# Patient Record
Sex: Female | Born: 2000 | Race: White | Hispanic: No | Marital: Single | Smoking: Never smoker
Health system: Southern US, Community
[De-identification: ages and names within clinical notes are randomized; demographics above are authoritative.]

## PROBLEM LIST (undated history)

## (undated) DIAGNOSIS — Z7689 Persons encountering health services in other specified circumstances: Principal | ICD-10-CM

## (undated) DIAGNOSIS — N898 Other specified noninflammatory disorders of vagina: Principal | ICD-10-CM

## (undated) DIAGNOSIS — N390 Urinary tract infection, site not specified: Secondary | ICD-10-CM

## (undated) DIAGNOSIS — N760 Acute vaginitis: Secondary | ICD-10-CM

## (undated) DIAGNOSIS — B9689 Other specified bacterial agents as the cause of diseases classified elsewhere: Secondary | ICD-10-CM

## (undated) HISTORY — DX: Acute vaginitis: N76.0

## (undated) HISTORY — PX: WISDOM TOOTH EXTRACTION: SHX21

## (undated) HISTORY — DX: Persons encountering health services in other specified circumstances: Z76.89

## (undated) HISTORY — DX: Other specified noninflammatory disorders of vagina: N89.8

## (undated) HISTORY — DX: Other specified bacterial agents as the cause of diseases classified elsewhere: B96.89

---
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
## 2018-01-28 ENCOUNTER — Encounter: Payer: Self-pay | Admitting: Emergency Medicine

## 2018-01-28 ENCOUNTER — Other Ambulatory Visit: Payer: Self-pay

## 2018-01-28 ENCOUNTER — Ambulatory Visit
Admission: EM | Admit: 2018-01-28 | Discharge: 2018-01-28 | Disposition: A | Discharge status: Home / Self Care | Payer: BLUE CROSS/BLUE SHIELD | Attending: Family Medicine | Admitting: Family Medicine

## 2018-01-28 DIAGNOSIS — R112 Nausea with vomiting, unspecified: Secondary | ICD-10-CM | POA: Diagnosis not present

## 2018-01-28 DIAGNOSIS — Z3202 Encounter for pregnancy test, result negative: Secondary | ICD-10-CM

## 2018-01-28 DIAGNOSIS — R197 Diarrhea, unspecified: Secondary | ICD-10-CM | POA: Diagnosis not present

## 2018-01-28 LAB — CBC WITH DIFFERENTIAL/PLATELET
BASOS ABS: 0.1 10*3/uL (ref 0–0.1)
BASOS PCT: 1 %
Eosinophils Absolute: 0 10*3/uL (ref 0–0.7)
Eosinophils Relative: 0 %
HEMATOCRIT: 43 % (ref 35.0–47.0)
Hemoglobin: 14.9 g/dL (ref 12.0–16.0)
LYMPHS PCT: 15 %
Lymphs Abs: 1 10*3/uL (ref 1.0–3.6)
MCH: 29.8 pg (ref 26.0–34.0)
MCHC: 34.7 g/dL (ref 32.0–36.0)
MCV: 86.1 fL (ref 80.0–100.0)
Monocytes Absolute: 0.3 10*3/uL (ref 0.2–0.9)
Monocytes Relative: 4 %
NEUTROS ABS: 5.5 10*3/uL (ref 1.4–6.5)
Neutrophils Relative %: 80 %
PLATELETS: 213 10*3/uL (ref 150–440)
RBC: 4.99 MIL/uL (ref 3.80–5.20)
RDW: 13.3 % (ref 11.5–14.5)
WBC: 6.9 10*3/uL (ref 3.6–11.0)

## 2018-01-28 LAB — COMPREHENSIVE METABOLIC PANEL
ALBUMIN: 4.5 g/dL (ref 3.5–5.0)
ALT: 12 U/L (ref 0–44)
AST: 16 U/L (ref 15–41)
Alkaline Phosphatase: 82 U/L (ref 38–126)
Anion gap: 10 (ref 5–15)
BILIRUBIN TOTAL: 0.9 mg/dL (ref 0.3–1.2)
BUN: 8 mg/dL (ref 6–20)
CHLORIDE: 108 mmol/L (ref 98–111)
CO2: 22 mmol/L (ref 22–32)
CREATININE: 0.81 mg/dL (ref 0.44–1.00)
Calcium: 9.6 mg/dL (ref 8.9–10.3)
GFR calc Af Amer: >60 mL/min (ref >60)
GLUCOSE: 101 mg/dL — AB (ref 70–99)
Potassium: 3.8 mmol/L (ref 3.5–5.1)
Sodium: 140 mmol/L (ref 135–145)
Total Protein: 7.8 g/dL (ref 6.5–8.1)

## 2018-01-28 LAB — PREGNANCY, URINE: PREG TEST UR: NEGATIVE

## 2018-01-28 MED ORDER — ONDANSETRON 4 MG PO TBDP
4.0000 mg | ORAL_TABLET | Freq: Once | ORAL | Status: AC
Start: 2018-01-28 — End: 2018-01-28
  Administered 2018-01-28: 4 mg via ORAL

## 2018-01-28 MED ORDER — ONDANSETRON HCL 4 MG PO TABS: 4.0000 mg | ORAL_TABLET | Freq: Three times a day (TID) | ORAL | 0 refills | Status: DC | PRN

## 2018-01-28 NOTE — ED Triage Notes (Signed)
Patient c/o N/V/D off and on for 2 weeks.  Patient reports that she has nausea and vomiting every morning when she wakes up.

## 2018-01-28 NOTE — Discharge Instructions (Signed)
Medication as directed.  Lots of fluids.  Take care  Dr. Loriana Samad  

## 2018-01-28 NOTE — ED Provider Notes (Signed)
[facility]    CSN: [id number] Arrival date & time: 01/28/18  1035  History   Chief Complaint Chief Complaint  Patient presents with  . Nausea  . Emesis   HPI  17 year old female presents with nausea, vomiting, diarrhea.  Patient reports ongoing nausea, vomiting, diarrhea for the past 1.5 to 2 weeks.  She states that she recently suffered  loss of a friend.  Nausea and vomiting is most prominent in the morning.  She has intermittent diarrhea.  She states that she is averaging approximately 5 loose stools a day.  Emesis is nonbloody and nonbilious.  She states that it has been several months and she had a period. She has a Nexplanon in place.  No reports of fevers or chills.  No reported sick contacts.  She reports associated dizziness and presyncope.  Symptoms were worse today which prompted her visit.  No other complaints.  Past Medical History:  Diagnosis Date  . BV (bacterial vaginosis) 12/13/2014  . Menstrual extraction 02/14/2014  . Vaginal discharge 12/13/2014    Patient Active Problem List   Diagnosis Date Noted  . Nexplanon in place 02/26/2015  . Vaginal discharge 12/13/2014  . BV (bacterial vaginosis) 12/13/2014  . Menstrual extraction 02/14/2014    History reviewed. No pertinent surgical history.  OB History    Gravida  0   Para  0   Term  0   Preterm  0   AB  0   Living  0     SAB  0   TAB  0   Ectopic  0   Multiple  0   Live Births               Home Medications    Prior to Admission medications   Medication Sig Start Date End Date Taking? Authorizing Provider  busPIRone (BUSPAR) 10 MG tablet Take 10 mg by mouth 3 (three) times daily.   Yes [provider]  etonogestrel (NEXPLANON) 68 MG IMPL implant 1 each by Subdermal route once.   Yes [provider]  ondansetron (ZOFRAN) 4 MG tablet Take 1 tablet (4 mg total) by mouth every 8 (eight) hours as needed for nausea or vomiting. 01/28/18   Tommie Samsook, Marvie Calender G, DO     Family History Family History  Problem Relation Age of Onset  . Thyroid disease Mother   . Thyroid disease Maternal Grandmother     Social History Social History   Tobacco Use  . Smoking status: Never Smoker  . Smokeless tobacco: Never Used  Substance Use Topics  . Alcohol use: No  . Drug use: No     Allergies   Patient has no known allergies.   Review of Systems Review of Systems  Constitutional: Negative for fever.  Respiratory: Positive for chest tightness.   Gastrointestinal: Positive for diarrhea, nausea and vomiting.  Neurological: Positive for dizziness.   Physical Exam Triage Vital Signs ED Triage Vitals  Enc Vitals Group     BP 01/28/18 1046 122/82     Pulse Rate 01/28/18 1046 64     Resp 01/28/18 1046 14     Temp 01/28/18 1046 97.9 F (36.6 C)     Temp Source 01/28/18 1046 Oral     SpO2 01/28/18 1046 98 %     Weight 01/28/18 1047 125 lb (56.7 kg)     Height 01/28/18 1047 5\' 3"  (1.6 m)     Head Circumference --      Peak Flow --  Pain Score 01/28/18 1046 3     Pain Loc --      Pain Edu? --      Excl. in GC? --    Updated Vital Signs BP 122/82 (BP Location: Left Arm)   Pulse 64   Temp 97.9 F (36.6 C) (Oral)   Resp 14   Ht 5\' 3"  (1.6 m)   Wt 125 lb (56.7 kg)   LMP 01/25/2018   SpO2 98%   BMI 22.14 kg/m   Visual Acuity Right Eye Distance:   Left Eye Distance:   Bilateral Distance:    Right Eye Near:   Left Eye Near:    Bilateral Near:     Physical Exam  Constitutional: She is oriented to person, place, and time. She appears well-developed. No distress.  HENT:  Head: Normocephalic and atraumatic.  Mouth/Throat: Oropharynx is clear and moist.  Cardiovascular: Normal rate and regular rhythm.  Pulmonary/Chest: Effort normal and breath sounds normal. She has no wheezes. She has no rales.  Abdominal: Soft. She exhibits no distension. There is no tenderness.  Neurological: She is alert and oriented to person, place, and time.   Psychiatric: She has a normal mood and affect. Her behavior is normal.  Nursing note and vitals reviewed.  UC Treatments / Results  Labs (all labs ordered are listed, but only abnormal results are displayed) Labs Reviewed  COMPREHENSIVE METABOLIC PANEL - Abnormal; Notable for the following components:      Result Value   Glucose, Bld 101 (*)    All other components within normal limits  CBC WITH DIFFERENTIAL/PLATELET  PREGNANCY, URINE    EKG None  Radiology No results found.  Procedures Procedures (including critical care time)  Medications Ordered in UC Medications  ondansetron (ZOFRAN-ODT) disintegrating tablet 4 mg (4 mg Oral Given 01/28/18 1107)    Initial Impression / Assessment and Plan / UC Course  I have reviewed the triage vital signs and the nursing notes.  Pertinent labs & imaging results that were available during my care of the patient were reviewed by me and considered in my medical decision making (see chart for details).    17 year old female presents with nausea, vomiting, diarrhea.  Exam unremarkable.  Labs unrevealing.  Zofran as needed.  Supportive care and push fluids.  Work note given.  Final Clinical Impressions(s) / UC Diagnoses   Final diagnoses:  Nausea vomiting and diarrhea   Discharge Instructions   None    ED Prescriptions    Medication Sig Dispense Auth. Provider   ondansetron (ZOFRAN) 4 MG tablet Take 1 tablet (4 mg total) by mouth every 8 (eight) hours as needed for nausea or vomiting. 20 tablet Tommie Sams, DO     Controlled Substance Prescriptions [place] Controlled Substance Registry consulted? Not Applicable   Tommie Sams, DO 01/28/18 1151

## 2018-09-07 DIAGNOSIS — K529 Noninfective gastroenteritis and colitis, unspecified: Secondary | ICD-10-CM | POA: Diagnosis not present

## 2018-09-07 DIAGNOSIS — J111 Influenza due to unidentified influenza virus with other respiratory manifestations: Secondary | ICD-10-CM | POA: Diagnosis not present

## 2018-09-07 DIAGNOSIS — J329 Chronic sinusitis, unspecified: Secondary | ICD-10-CM | POA: Diagnosis not present

## 2019-01-22 DIAGNOSIS — N390 Urinary tract infection, site not specified: Secondary | ICD-10-CM | POA: Diagnosis not present

## 2019-01-22 DIAGNOSIS — Z113 Encounter for screening for infections with a predominantly sexual mode of transmission: Secondary | ICD-10-CM | POA: Diagnosis not present

## 2019-01-24 ENCOUNTER — Ambulatory Visit (INDEPENDENT_AMBULATORY_CARE_PROVIDER_SITE_OTHER)
Admission: RE | Admit: 2019-01-24 | Discharge: 2019-01-24 | Disposition: A | Discharge status: Home / Self Care | Payer: BC Managed Care – PPO | Source: Ambulatory Visit

## 2019-01-24 DIAGNOSIS — N760 Acute vaginitis: Secondary | ICD-10-CM | POA: Diagnosis not present

## 2019-01-24 DIAGNOSIS — B9689 Other specified bacterial agents as the cause of diseases classified elsewhere: Secondary | ICD-10-CM | POA: Diagnosis not present

## 2019-01-24 MED ORDER — METRONIDAZOLE 0.75 % VA GEL: 1.0000 | Freq: Every day | VAGINAL | 0 refills | Status: AC

## 2019-01-24 NOTE — Discharge Instructions (Signed)
We will change your medication Follow up as needed for continued or worsening symptoms

## 2019-01-24 NOTE — ED Provider Notes (Signed)
Virtual Visit via Video Note:  Hansika Leaming  initiated request for Telemedicine visit with [facility] team. I connected with Jeannine Boga  on 01/24/2019 at 9:28 AM  for a synchronized telemedicine visit using a video enabled HIPPA compliant telemedicine application. I verified that I am speaking with Jeannine Boga  using two identifiers. Orvan July, NP  was physically located in a [facility] Urgent care site and Aolani Piggott was located at a different location.   The limitations of evaluation and management by telemedicine as well as the availability of in-person appointments were discussed. Patient was informed that she  may incur a bill ( including co-pay) for this virtual visit encounter. Robertine Gully  expressed understanding and gave verbal consent to proceed with virtual visit.     History of Present Illness:Lori [name]  is a 18 y.o. female presents with needing medication change. She was prescribed metronidazole for BV. She is unable to  swallow the pills. She would like a different medication.  She has been also taking Macrobid for UTI which she tolerates well.   Past Medical History:  Diagnosis Date  . BV (bacterial vaginosis) 12/13/2014  . Menstrual extraction 02/14/2014  . Vaginal discharge 12/13/2014    No Known Allergies      Observations/Objective: GENERAL APPEARANCE: Well developed, well nourished, alert and cooperative, and appears to be in no acute distress. HEAD: normocephalic. Non labored breathing, no dyspnea or distress Skin: Skin normal color  PSYCHIATRIC: The mental examination revealed the patient was oriented to person, place, and time. The patient was able to demonstrate good judgement and reason, without hallucinations, abnormal affect or abnormal behaviors during the examination. Patient is not suicidal     Assessment and Plan: Changing from oral metronidazole to vaginal   Follow Up Instructions: Follow up as needed for continued or  worsening symptoms      I discussed the assessment and treatment plan with the patient. The patient was provided an opportunity to ask questions and all were answered. The patient agreed with the plan and demonstrated an understanding of the instructions.   The patient was advised to call back or seek an in-person evaluation if the symptoms worsen or if the condition fails to improve as anticipated.     Orvan July, NP  01/24/2019 9:28 AM         Orvan July, NP 01/24/19 [phone]

## 2019-02-24 ENCOUNTER — Other Ambulatory Visit: Payer: BC Managed Care – PPO

## 2019-02-24 DIAGNOSIS — Z20822 Contact with and (suspected) exposure to covid-19: Secondary | ICD-10-CM

## 2019-02-24 DIAGNOSIS — R6889 Other general symptoms and signs: Secondary | ICD-10-CM | POA: Diagnosis not present

## 2019-02-27 LAB — NOVEL CORONAVIRUS, NAA: SARS-CoV-2, NAA: NOT DETECTED

## 2019-05-11 ENCOUNTER — Other Ambulatory Visit: Payer: Self-pay

## 2019-05-11 DIAGNOSIS — Z20828 Contact with and (suspected) exposure to other viral communicable diseases: Secondary | ICD-10-CM | POA: Diagnosis not present

## 2019-05-11 DIAGNOSIS — Z20822 Contact with and (suspected) exposure to covid-19: Secondary | ICD-10-CM

## 2019-05-13 LAB — NOVEL CORONAVIRUS, NAA: SARS-CoV-2, NAA: NOT DETECTED

## 2019-06-22 ENCOUNTER — Ambulatory Visit: Payer: Self-pay | Admitting: Physician Assistant

## 2019-06-22 ENCOUNTER — Other Ambulatory Visit: Payer: Self-pay

## 2019-06-22 DIAGNOSIS — Z113 Encounter for screening for infections with a predominantly sexual mode of transmission: Secondary | ICD-10-CM

## 2019-06-22 LAB — WET PREP FOR TRICH, YEAST, CLUE
Trichomonas Exam: NEGATIVE
Yeast Exam: NEGATIVE

## 2019-06-23 ENCOUNTER — Encounter: Payer: Self-pay | Admitting: Physician Assistant

## 2019-06-23 NOTE — Progress Notes (Signed)
    STI clinic/screening visit  Subjective:  Lori [name] is a 18 y.o. female being seen today for an STI screening visit. The patient reports they do have symptoms.  Patient has the following medical conditions:   Patient Active Problem List   Diagnosis Date Noted  . Nexplanon in place 02/26/2015  . Vaginal discharge 12/13/2014  . BV (bacterial vaginosis) 12/13/2014  . Menstrual extraction 02/14/2014     Chief Complaint  Patient presents with  . SEXUALLY TRANSMITTED DISEASE    HPI  Patient reports that she has noticed that her vaginal discharge has been "off color" for about 2 weeks.  Denies any other symptoms.  Using Nexplanon for [facility] and does not have periods with the Nexplanon.   See flowsheet for further details and programmatic requirements.    The following portions of the patient's history were reviewed and updated as appropriate: allergies, current medications, past medical history, past social history, past surgical history and problem list.  Objective:  There were no vitals filed for this visit.  Physical Exam Constitutional:      General: She is not in acute distress.    Appearance: Normal appearance. She is normal weight.  HENT:     Head: Normocephalic and atraumatic.     Mouth/Throat:     Mouth: Mucous membranes are moist.     Pharynx: Oropharynx is clear. No oropharyngeal exudate or posterior oropharyngeal erythema.  Eyes:     Conjunctiva/sclera: Conjunctivae normal.  Neck:     Musculoskeletal: Neck supple.  Pulmonary:     Effort: Pulmonary effort is normal.  Abdominal:     Palpations: Abdomen is soft. There is no mass.     Tenderness: There is no abdominal tenderness. There is no guarding or rebound.  Genitourinary:    General: Normal vulva.     Rectum: Normal.     Comments: External genitalia/pubic area without nits, lice, edema, erythema, lesions and inguinal adenopathy. Vagina with normal mucosa and small amount of normal discharge. Cervix  without visible lesions. Uterus firm, mobile, nt, no masses, no CMT, no adnexal tenderness. Lymphadenopathy:     Cervical: No cervical adenopathy.  Skin:    General: Skin is warm and dry.     Findings: No bruising, erythema, lesion or rash.  Neurological:     Mental Status: She is alert and oriented to person, place, and time.  Psychiatric:        Mood and Affect: Mood normal.        Behavior: Behavior normal.        Thought Content: Thought content normal.        Judgment: Judgment normal.       Assessment and Plan:  Lori [name] is a 18 y.o. female presenting to the [facility] Department for STI screening  1. Screening for STD (sexually transmitted disease) Patient into clinic for screening. Rec condoms with all sex. Await test results.  Counseled that RN will call if needs to RTC for treatment once results are back. - WET PREP FOR [place], YEAST, [facility] - Syphilis Serology,  Lab     No follow-ups on file.  No future appointments.  Jerene Dilling, PA

## 2019-06-28 ENCOUNTER — Encounter: Payer: Self-pay | Admitting: Physician Assistant

## 2019-08-17 DIAGNOSIS — J309 Allergic rhinitis, unspecified: Secondary | ICD-10-CM | POA: Diagnosis not present

## 2019-08-29 ENCOUNTER — Other Ambulatory Visit: Payer: Self-pay

## 2019-08-29 ENCOUNTER — Encounter: Payer: Self-pay | Admitting: Adult Health

## 2019-08-29 ENCOUNTER — Ambulatory Visit: Payer: BC Managed Care – PPO | Admitting: Adult Health

## 2019-08-29 VITALS — BP 97/62 | HR 90 | Ht 63.0 in | Wt 128.0 lb

## 2019-08-29 DIAGNOSIS — N926 Irregular menstruation, unspecified: Secondary | ICD-10-CM | POA: Diagnosis not present

## 2019-08-29 DIAGNOSIS — Z975 Presence of (intrauterine) contraceptive device: Secondary | ICD-10-CM

## 2019-08-29 NOTE — Progress Notes (Signed)
  Subjective:     Patient ID: Lori [name], female   DOB: [birth year], 19 y.o.   MRN: [id number]  HPI Lori [name] is a a 19 year old white female, single, G0P0, in complaining of bleeding on Nexplanon, this is first time since insertion 09/24/17. It is lighter and brown now, no pain PCP is Dwyane Luo PA.   Review of Systems Had second nexplanon inserted 09/24/17 in [place] and has had had nay bleeding til last week, was brown, then kinda heavy and red like a period and now lighter and brown Denies any pain or odor Last sex over 4 months ago and has had negative STD testing recently  Reviewed past medical,surgical, social and family history. Reviewed medications and allergies.     Objective:   Physical Exam BP 97/62 (BP Location: Left Arm, Patient Position: Sitting, Cuff Size: Normal)   Pulse 90   Ht 5\' 3"  (1.6 m)   Wt 128 lb (58.1 kg)   BMI 22.67 kg/m  Skin warm and dry. Neck: mid line trachea, normal thyroid, good ROM, no lymphadenopathy noted. Lungs: clear to ausculation bilaterally. Cardiovascular: regular rate and rhythm.    Assessment:     1. Irregular bleeding Will watch for now, if starts back heavy, call and will rx megace   2. Nexplanon in place     Plan:     Return about 01/25/20 for pap and physical

## 2020-08-18 ENCOUNTER — Emergency Department (HOSPITAL_COMMUNITY): Payer: BC Managed Care – PPO

## 2020-08-18 ENCOUNTER — Encounter (HOSPITAL_COMMUNITY): Payer: Self-pay | Admitting: Emergency Medicine

## 2020-08-18 ENCOUNTER — Emergency Department (HOSPITAL_COMMUNITY)
Admission: EM | Admit: 2020-08-18 | Discharge: 2020-08-18 | Disposition: A | Discharge status: Home / Self Care | Payer: BC Managed Care – PPO | Attending: Emergency Medicine | Admitting: Emergency Medicine

## 2020-08-18 ENCOUNTER — Other Ambulatory Visit: Payer: Self-pay

## 2020-08-18 DIAGNOSIS — M545 Low back pain, unspecified: Secondary | ICD-10-CM

## 2020-08-18 DIAGNOSIS — R1031 Right lower quadrant pain: Secondary | ICD-10-CM | POA: Diagnosis not present

## 2020-08-18 DIAGNOSIS — B379 Candidiasis, unspecified: Secondary | ICD-10-CM | POA: Insufficient documentation

## 2020-08-18 DIAGNOSIS — M4306 Spondylolysis, lumbar region: Secondary | ICD-10-CM | POA: Diagnosis not present

## 2020-08-18 HISTORY — DX: Urinary tract infection, site not specified: N39.0

## 2020-08-18 LAB — COMPREHENSIVE METABOLIC PANEL
ALT: 14 U/L (ref 0–44)
AST: 13 U/L — ABNORMAL LOW (ref 15–41)
Albumin: 4.3 g/dL (ref 3.5–5.0)
Alkaline Phosphatase: 59 U/L (ref 38–126)
Anion gap: 6 (ref 5–15)
BUN: 10 mg/dL (ref 6–20)
CO2: 25 mmol/L (ref 22–32)
Calcium: 9.3 mg/dL (ref 8.9–10.3)
Chloride: 105 mmol/L (ref 98–111)
Creatinine, Ser: 0.73 mg/dL (ref 0.44–1.00)
GFR, Estimated: >60 mL/min (ref >60)
Glucose, Bld: 107 mg/dL — ABNORMAL HIGH (ref 70–99)
Potassium: 3.9 mmol/L (ref 3.5–5.1)
Sodium: 136 mmol/L (ref 135–145)
Total Bilirubin: 0.8 mg/dL (ref 0.3–1.2)
Total Protein: 7.3 g/dL (ref 6.5–8.1)

## 2020-08-18 LAB — URINALYSIS, ROUTINE W REFLEX MICROSCOPIC
Bilirubin Urine: NEGATIVE
Glucose, UA: NEGATIVE mg/dL
Hgb urine dipstick: NEGATIVE
Ketones, ur: 5 mg/dL — AB
Nitrite: NEGATIVE
Protein, ur: NEGATIVE mg/dL
Specific Gravity, Urine: 1.011 (ref 1.005–1.030)
pH: 8 (ref 5.0–8.0)

## 2020-08-18 LAB — CBC WITH DIFFERENTIAL/PLATELET
Abs Immature Granulocytes: 0.04 10*3/uL (ref 0.00–0.07)
Basophils Absolute: 0.1 10*3/uL (ref 0.0–0.1)
Basophils Relative: 1 %
Eosinophils Absolute: 0 10*3/uL (ref 0.0–0.5)
Eosinophils Relative: 0 %
HCT: 43.3 % (ref 36.0–46.0)
Hemoglobin: 14.6 g/dL (ref 12.0–15.0)
Immature Granulocytes: 0 %
Lymphocytes Relative: 7 %
Lymphs Abs: 0.8 10*3/uL (ref 0.7–4.0)
MCH: 30.3 pg (ref 26.0–34.0)
MCHC: 33.7 g/dL (ref 30.0–36.0)
MCV: 89.8 fL (ref 80.0–100.0)
Monocytes Absolute: 0.3 10*3/uL (ref 0.1–1.0)
Monocytes Relative: 3 %
Neutro Abs: 9.9 10*3/uL — ABNORMAL HIGH (ref 1.7–7.7)
Neutrophils Relative %: 89 %
Platelets: 225 10*3/uL (ref 150–400)
RBC: 4.82 MIL/uL (ref 3.87–5.11)
RDW: 12.6 % (ref 11.5–15.5)
WBC: 11 10*3/uL — ABNORMAL HIGH (ref 4.0–10.5)
nRBC: 0 % (ref 0.0–0.2)

## 2020-08-18 LAB — WET PREP, GENITAL
Clue Cells Wet Prep HPF POC: NONE SEEN
Sperm: NONE SEEN
Trich, Wet Prep: NONE SEEN

## 2020-08-18 LAB — HCG, QUANTITATIVE, PREGNANCY: hCG, Beta Chain, Quant, S: <1 m[IU]/mL (ref <5)

## 2020-08-18 LAB — LIPASE, BLOOD: Lipase: 21 U/L (ref 11–51)

## 2020-08-18 MED ORDER — SODIUM CHLORIDE 0.9 % IV BOLUS
500.0000 mL | Freq: Once | INTRAVENOUS | Status: AC
  Administered 2020-08-18: 500 mL via INTRAVENOUS

## 2020-08-18 MED ORDER — CYCLOBENZAPRINE HCL 10 MG PO TABS: 10.0000 mg | ORAL_TABLET | Freq: Every day | ORAL | 0 refills | Status: AC

## 2020-08-18 MED ORDER — ONDANSETRON HCL 4 MG/2ML IJ SOLN
4.0000 mg | Freq: Once | INTRAMUSCULAR | Status: AC
  Administered 2020-08-18: 4 mg via INTRAVENOUS
  Filled 2020-08-18: qty 2

## 2020-08-18 MED ORDER — FLUCONAZOLE 150 MG PO TABS
150.0000 mg | ORAL_TABLET | Freq: Once | ORAL | Status: AC
  Administered 2020-08-18: 150 mg via ORAL
  Filled 2020-08-18: qty 1

## 2020-08-18 NOTE — ED Provider Notes (Addendum)
[facility] EMERGENCY DEPARTMENT Provider Note   CSN: [id number] Arrival date & time: 08/18/20  1137     History Chief Complaint  Patient presents with  . Abdominal Pain    Lori [name] is a 20 y.o. female.  HPI   Patient is a 20 year old female with a medical history as noted below.  She presents today due to low back and right-sided abdominal pain.  She states last night she began experiencing right lower back pain.  It was moderate.  She states that she went to sleep.  She woke up this morning and was experiencing right-sided abdominal pain as well as right lower quadrant abdominal pain.  She states her pain was so severe she had multiple episodes of nausea and vomiting.  She was brought to the emergency department.  Since arrival, her pain has almost completely resolved.  Denies any current nausea.  Denies any urinary complaints.  No dysuria, hematuria, vaginal discharge, urinary frequency.  She does note that she was treated for a UTI earlier this week and finished 5 days of Keflex yesterday.  She is in a monogamous relationship with a female partner. Has a Nexplanon implant. Unsure if she is pregnant but states that she had a menstrual cycle 2 weeks ago. She notes that her Nexplanon implant is scheduled to be removed in late February and recently she has been experiencing light menstrual cycles on a monthly basis.  Patient has a history of kidney stones.     Past Medical History:  Diagnosis Date  . BV (bacterial vaginosis) 12/13/2014  . Menstrual extraction 02/14/2014  . UTI (urinary tract infection)   . Vaginal discharge 12/13/2014    Patient Active Problem List   Diagnosis Date Noted  . Irregular bleeding 08/29/2019  . Nexplanon in place 02/26/2015  . Vaginal discharge 12/13/2014  . BV (bacterial vaginosis) 12/13/2014  . Menstrual extraction 02/14/2014    History reviewed. No pertinent surgical history.   OB History    Gravida  0   Para  0   Term  0   Preterm   0   AB  0   Living  0     SAB  0   IAB  0   Ectopic  0   Multiple  0   Live Births              Family History  Problem Relation Age of Onset  . Thyroid disease Mother   . Thyroid disease Maternal Grandmother     Social History   Tobacco Use  . Smoking status: Never Smoker  . Smokeless tobacco: Never Used  Vaping Use  . Vaping Use: Some days  . Substances: Nicotine, Flavoring  Substance Use Topics  . Alcohol use: No    Comment: occ  . Drug use: No    Home Medications Prior to Admission medications   Medication Sig Start Date End Date Taking? Authorizing Provider  cyclobenzaprine (FLEXERIL) 10 MG tablet Take 1 tablet (10 mg total) by mouth at bedtime for 7 days. 08/18/20 08/25/20 Yes Placido SouJoldersma, Glenis Musolf, PA-C  etonogestrel (NEXPLANON) 68 MG IMPL implant 1 each by Subdermal route once.    [provider]    Allergies    Macrobid [nitrofurantoin]  Review of Systems   Review of Systems  All other systems reviewed and are negative. Ten systems reviewed and are negative for acute change, except as noted in the HPI.   Physical Exam Updated Vital Signs BP 105/67 (BP Location:  Left Arm)   Pulse 75   Temp 97.8 F (36.6 C) (Oral)   Resp 17   Ht 5\' 3"  (1.6 m)   Wt 61.2 kg   SpO2 100%   BMI 23.91 kg/m   Physical Exam Vitals and nursing note reviewed.  Constitutional:      General: She is not in acute distress.    Appearance: Normal appearance. She is well-developed and normal weight. She is not ill-appearing, toxic-appearing or diaphoretic.  HENT:     Head: Normocephalic and atraumatic.     Right Ear: External ear normal.     Left Ear: External ear normal.     Nose: Nose normal.     Mouth/Throat:     Mouth: Mucous membranes are moist.     Pharynx: Oropharynx is clear. No oropharyngeal exudate or posterior oropharyngeal erythema.  Eyes:     Extraocular Movements: Extraocular movements intact.  Cardiovascular:     Rate and Rhythm: Normal rate  and regular rhythm.     Pulses: Normal pulses.     Heart sounds: Normal heart sounds. No murmur heard. No friction rub. No gallop.   Pulmonary:     Effort: Pulmonary effort is normal. No respiratory distress.     Breath sounds: Normal breath sounds. No stridor. No wheezing, rhonchi or rales.  Abdominal:     General: Abdomen is flat.     Palpations: Abdomen is soft.     Tenderness: There is no abdominal tenderness. There is no right CVA tenderness or left CVA tenderness.     Comments: Abdomen is flat soft and nontender.  No lower abdominal tenderness appreciated with deep palpation. No pelvic tenderness.   Musculoskeletal:        General: Normal range of motion.     Cervical back: Normal range of motion and neck supple. No tenderness.       Back:     Comments: Moderate tenderness appreciated along the right low back.  No CVA tenderness.  No midline spine pain.  Skin:    General: Skin is warm and dry.  Neurological:     General: No focal deficit present.     Mental Status: She is alert and oriented to person, place, and time.  Psychiatric:        Mood and Affect: Mood normal.        Behavior: Behavior normal.    ED Results / Procedures / Treatments   Labs (all labs ordered are listed, but only abnormal results are displayed) Labs Reviewed  WET PREP, GENITAL - Abnormal; Notable for the following components:      Result Value   Yeast Wet Prep HPF POC PRESENT (*)    WBC, Wet Prep HPF POC MANY (*)    All other components within normal limits  CBC WITH DIFFERENTIAL/PLATELET - Abnormal; Notable for the following components:   WBC 11.0 (*)    Neutro Abs 9.9 (*)    All other components within normal limits  COMPREHENSIVE METABOLIC PANEL - Abnormal; Notable for the following components:   Glucose, Bld 107 (*)    AST 13 (*)    All other components within normal limits  URINALYSIS, ROUTINE W REFLEX MICROSCOPIC - Abnormal; Notable for the following components:   APPearance HAZY (*)     Ketones, ur 5 (*)    Leukocytes,Ua TRACE (*)    Bacteria, UA FEW (*)    All other components within normal limits  LIPASE, BLOOD  HCG, QUANTITATIVE, PREGNANCY  GC/CHLAMYDIA  PROBE AMP ([place]) NOT AT [facility]   EKG None  Radiology CT Renal Stone Study  Result Date: 08/18/2020 CLINICAL DATA:  Acute right flank pain. EXAM: CT ABDOMEN AND PELVIS WITHOUT CONTRAST TECHNIQUE: Multidetector CT imaging of the abdomen and pelvis was performed following the standard protocol without IV contrast. COMPARISON:  None. FINDINGS: Lower chest: No acute abnormality. Hepatobiliary: No focal liver abnormality is seen. No gallstones, gallbladder wall thickening, or biliary dilatation. Pancreas: Unremarkable. No pancreatic ductal dilatation or surrounding inflammatory changes. Spleen: Normal in size without focal abnormality. Adrenals/Urinary Tract: Adrenal glands are unremarkable. Kidneys are normal, without renal calculi, focal lesion, or hydronephrosis. Bladder is unremarkable. Stomach/Bowel: Stomach is within normal limits. Appendix appears normal. No evidence of bowel wall thickening, distention, or inflammatory changes. Vascular/Lymphatic: No significant vascular findings are present. No enlarged abdominal or pelvic lymph nodes. Reproductive: Uterus and bilateral adnexa are unremarkable. Other: No abdominal wall hernia or abnormality. No abdominopelvic ascites. Musculoskeletal: Bilateral L5 spondylolysis is noted. No acute osseous abnormality is noted. IMPRESSION: 1. Bilateral L5 spondylolysis. 2. No other abnormality seen in the abdomen or pelvis. Electronically Signed   By: Lupita Raider M.D.   On: 08/18/2020 13:48   Procedures Procedures (including critical care time)  Medications Ordered in ED Medications  sodium chloride 0.9 % bolus 500 mL (has no administration in time range)  fluconazole (DIFLUCAN) tablet 150 mg (has no administration in time range)  sodium chloride 0.9 % bolus 500 mL (0 mLs  Intravenous Stopped 08/18/20 1402)  ondansetron (ZOFRAN) injection 4 mg (4 mg Intravenous Given 08/18/20 1231)    ED Course  I have reviewed the triage vital signs and the nursing notes.  Pertinent labs & imaging results that were available during my care of the patient were reviewed by me and considered in my medical decision making (see chart for details).  Clinical Course as of 08/18/20 1451  Sun Aug 18, 2020  1309 WBC(!): 11.0 [LJ]  1309 NEUT#(!): 9.9 [LJ]  1351 CT Renal Stone Study IMPRESSION: 1. Bilateral L5 spondylolysis. 2. No other abnormality seen in the abdomen or pelvis. [LJ]  1411 Leukocytes,Ua(!): TRACE [LJ]  1412 Bacteria, UA(!): FEW [LJ]  1412 Ketones, ur(!): 5 [LJ]  1436 WBC, Wet Prep HPF POC(!): MANY [LJ]  1436 Yeast Wet Prep HPF POC(!): PRESENT [LJ]    Clinical Course User Index [LJ] Placido Sou, PA-C   MDM Rules/Calculators/A&P                          Patient is a 20 year old female with a history of kidney stones who presents the emergency department with right low back pain.  No flank pain.  Patient had no abdominal pain on my exam but this morning was complaining of abdominal pain prior to arrival.  This is resolved.  Given her history of kidney stones and her symptoms today, I obtained a CT renal stone study.  This showed bilateral L5 spondylolysis but otherwise no abnormality seen in the abdomen or pelvis.  Patient has no abdominal pain or pelvic pain.  No GU complaints.  Patient states her pain has mostly resolved but continues to worsen with movement.  Appears to be musculoskeletal in nature.  No known trauma.  No midline spine pain.  Prior to discharge, patient requested to be tested for STDs.  Given her lack of abdominal pain as well as GU complaints, she elected to self swab.  Wet prep shows many white blood cells  as well as yeast.  Patient recently took 5 days of Keflex for a UTI.  Likely the cause of her yeast infection.  We will give a dose of  Diflucan in the ED.  Will discharge on a course of Flexeril.  Patient is going to check the results of her gonorrhea/chlamydia test on MyChart.  She understands she needs to return for treatment if this test is positive.  Given her lack of symptoms, feel that it is reasonable to not treat empirically at this time.  We discussed safety regarding this medication.  Recommended PCP follow-up.  We discussed return precautions.  Her questions were answered and she was amicable at the time of discharge.  Final Clinical Impression(s) / ED Diagnoses Final diagnoses:  Lumbar pain  Yeast infection   Rx / DC Orders ED Discharge Orders         Ordered    cyclobenzaprine (FLEXERIL) 10 MG tablet  Daily at bedtime        08/18/20 1445           Placido Sou, PA-C 08/18/20 1450    Placido Sou, PA-C 08/18/20 1451    Bethann Berkshire, MD 08/18/20 1453

## 2020-08-18 NOTE — Discharge Instructions (Addendum)
I am prescribing you a strong muscle relaxer called flexeril. Please only take this medication once in the evening with dinner. This medication can make you quite drowsy. Do not mix it with alcohol. Do not drive a vehicle after taking it.   Please check the results of your gonorrhea/chlamydia test on MyChart.  If positive, you need to come back to the emergency department, urgent care, or visit your primary care provider so that you can be treated.  If your symptoms worsen, please return to the emergency department for reevaluation.  It was a pleasure to meet you.

## 2020-08-18 NOTE — ED Notes (Signed)
Patient is aware that we need a urine sample.    

## 2020-08-18 NOTE — ED Triage Notes (Signed)
Pt to the ED with c/o right lower abdominal pain that wraps around to her lower back with nausea and vomiting since last night.  Pt was treated with Keflex for a UTI last week.

## 2020-08-21 LAB — GC/CHLAMYDIA PROBE AMP (~~LOC~~) NOT AT ARMC
Chlamydia: NEGATIVE
Comment: NEGATIVE
Comment: NORMAL
Neisseria Gonorrhea: NEGATIVE

## 2020-09-23 ENCOUNTER — Ambulatory Visit (INDEPENDENT_AMBULATORY_CARE_PROVIDER_SITE_OTHER): Payer: BC Managed Care – PPO | Admitting: Obstetrics

## 2020-09-23 ENCOUNTER — Other Ambulatory Visit (HOSPITAL_COMMUNITY)
Admission: RE | Admit: 2020-09-23 | Discharge: 2020-09-23 | Disposition: A | Discharge status: Home / Self Care | Payer: BC Managed Care – PPO | Source: Ambulatory Visit | Attending: Obstetrics | Admitting: Obstetrics

## 2020-09-23 ENCOUNTER — Other Ambulatory Visit: Payer: Self-pay

## 2020-09-23 ENCOUNTER — Encounter: Payer: Self-pay | Admitting: Obstetrics

## 2020-09-23 VITALS — BP 112/70 | Ht 63.6 in | Wt 133.6 lb

## 2020-09-23 DIAGNOSIS — Z30017 Encounter for initial prescription of implantable subdermal contraceptive: Secondary | ICD-10-CM | POA: Diagnosis not present

## 2020-09-23 DIAGNOSIS — Z01419 Encounter for gynecological examination (general) (routine) without abnormal findings: Secondary | ICD-10-CM | POA: Diagnosis not present

## 2020-09-23 DIAGNOSIS — Z3046 Encounter for surveillance of implantable subdermal contraceptive: Secondary | ICD-10-CM

## 2020-09-23 DIAGNOSIS — Z113 Encounter for screening for infections with a predominantly sexual mode of transmission: Secondary | ICD-10-CM | POA: Insufficient documentation

## 2020-09-23 DIAGNOSIS — Z124 Encounter for screening for malignant neoplasm of cervix: Secondary | ICD-10-CM

## 2020-09-23 NOTE — Progress Notes (Signed)
Gynecology Annual Exam  PCP: Shawnie Dapper, PA-C  Chief Complaint:  Chief Complaint  Patient presents with  . Gynecologic Exam    History of Present Illness: Patient is a 20 y.o. G0P0000 presents for annual exam. The patient is also requesting removal of her Nexplanon, which is due, and the placement of another Nexplanon. Her present Nexplanon was due for removal this month.  She is very happy with the method.  LMP: No LMP recorded. Patient has had an implant. Menarche:10 Average Interval: irregular and rare bleeding pattern. Her periods, when they occur, are very light., 28 days Duration of flow: 3 days Heavy Menses: no Clots: no Intermenstrual Bleeding: no Postcoital Bleeding: no Dysmenorrhea: no  The patient is sexually active. She currently uses Nexplanon for contraception. She denies dyspareunia.  The patient does perform self breast exams.  There is no notable family history of breast or ovarian cancer in her family.  The patient wears seatbelts: yes.  The patient has regular exercise: yes.    The patient denies current symptoms of depression.    Review of Systems: Review of Systems  Constitutional: Negative.   Eyes: Negative.   Respiratory: Negative.   Cardiovascular: Negative.   Gastrointestinal: Negative.   Skin: Negative.   Neurological: Negative.   Endo/Heme/Allergies: Negative.   Psychiatric/Behavioral: Negative.     Past Medical History:  Patient Active Problem List   Diagnosis Date Noted  . Irregular bleeding 08/29/2019  . Nexplanon in place 02/26/2015    Inserted 02/26/15   . Vaginal discharge 12/13/2014  . BV (bacterial vaginosis) 12/13/2014  . Menstrual extraction 02/14/2014    Past Surgical History:  No past surgical history on file.  Gynecologic History:  No LMP recorded. Patient has had an implant. Contraception: Nexplanon Last Pap: Results were: NA Has not had a pap. This is her first.   Obstetric History: G0P0000  Family  History:  Family History  Problem Relation Age of Onset  . Thyroid disease Mother   . Thyroid disease Maternal Grandmother     Social History:  Social History   Socioeconomic History  . Marital status: Single    Spouse name: Not on file  . Number of children: Not on file  . Years of education: Not on file  . Highest education level: Not on file  Occupational History  . Not on file  Tobacco Use  . Smoking status: Never Smoker  . Smokeless tobacco: Never Used  Vaping Use  . Vaping Use: Some days  . Substances: Nicotine, Flavoring  Substance and Sexual Activity  . Alcohol use: No    Comment: occ  . Drug use: No  . Sexual activity: Not Currently    Birth control/protection: Implant  Other Topics Concern  . Not on file  Social History Narrative  . Not on file   Social Determinants of Health   Financial Resource Strain: Not on file  Food Insecurity: Not on file  Transportation Needs: Not on file  Physical Activity: Not on file  Stress: Not on file  Social Connections: Not on file  Intimate Partner Violence: Not on file    Allergies:  Allergies  Allergen Reactions  . Macrobid [Nitrofurantoin] Nausea And Vomiting    Medications: Prior to Admission medications   Medication Sig Start Date End Date Taking? Authorizing Provider  etonogestrel (NEXPLANON) 68 MG IMPL implant 1 each by Subdermal route once.    [provider]    Physical Exam Vitals: Blood pressure 112/70,  height 5' 3.6" (1.615 m), weight 133 lb 9.6 oz (60.6 kg).  General: NAD HEENT: normocephalic, anicteric Thyroid: no enlargement, no palpable nodules Pulmonary: No increased work of breathing, CTAB Cardiovascular: RRR, distal pulses 2+ Breast: Breast symmetrical, no tenderness, no palpable nodules or masses, no skin or nipple retraction present, no nipple discharge.  No axillary or supraclavicular lymphadenopathy. Abdomen: NABS, soft, non-tender, non-distended.  Umbilicus without lesions.   No hepatomegaly, splenomegaly or masses palpable. No evidence of hernia  Genitourinary:  External: Normal external female genitalia.  Normal urethral meatus, normal Bartholin's and Skene's glands.    Vagina: Normal vaginal mucosa, no evidence of prolapse.    Cervix: Grossly normal in appearance, no bleeding  Uterus: Non-enlarged, mobile, normal contour.  No CMT  Adnexa: ovaries non-enlarged, no adnexal masses  Rectal: deferred  Lymphatic: no evidence of inguinal lymphadenopathy Extremities: no edema, erythema, or tenderness Neurologic: Grossly intact Psychiatric: mood appropriate, affect full  Female chaperone present for pelvic and breast  portions of the physical exam    Assessment: 20 y.o. G0P0000 routine annual exam  Plan: Problem List Items Addressed This Visit   None   Visit Diagnoses    Cervical cancer screening    -  Primary   Relevant Orders   Cytology - PAP   Routine screening for STI (sexually transmitted infection)       Relevant Orders   Cytology - PAP   Women's annual routine gynecological examination       Nexplanon removal       Nexplanon insertion          1) 4) Gardasil Series discussed and if applicable offered to patient - Patient has not previously completed 3 shot series   2) STI screening  wasoffered and accepted  3)  ASCCP guidelines and rational discussed.  Patient opts for yearly screening interval  4) Contraception - the patient is currently using  Nexplanon.  She is happy with her current form of contraception and plans to continue We discussed safe sex practices to reduce her furture risk of STI's.      GYNECOLOGY PROCEDURE NOTE  Implanon removal discussed in detail.  Risks of infection, bleeding, nerve injury all reviewed.  Patient understands risks and desires to proceed.  Verbal consent obtained.  Patient is certain she wants the implanon removed.  All questions answered.  Procedure: Patient placed in dorsal supine with left arm  above head, elbow flexed at 90 degrees, arm resting on examination table.  Implanon identified without problems.  Betadine scrub x3.  1 ml of 1% lidocaine injected under implanon device without problems.  Sterile gloves applied.  Small 0.5cm incision made at distal tip of implanon device with 11 blade scalpel.  Implanon brought to incision and grasped with a small kelly clamp.  Implanon removed intact without problems.  Pressure applied to incision.  Hemostasis obtained.  Steri-strips applied, followed by bandage and compression dressing.  Patient tolerated procedure well.  No complications.   Assessment: 20 y.o. year old female now s/p uncomplicated implanon removal.  Plan: 1.  Patient given post procedure precautions and asked to call for fever, chills, redness or drainage from her incision, bleeding from incision.  She understands she will likely have a small bruise near site of removal and can remove bandage tomorrow and steri-strips in approximately 1 week.  New Nexplanon now placed. See notes below.    GYNECOLOGY PROCEDURE NOTE  Patient is a 20 y.o. G0P0000 presenting for Nexplanon insertion as her desires  means of contraception.  She provided informed consent, signed copy in the chart, time out was performed. Pregnancy test was negative, with self reported LMP of No LMP recorded. Patient has had an implant.  She understands that Nexplanon is a progesterone only therapy, and that patients often patients have irregular and unpredictable vaginal bleeding or amenorrhea. She understands that other side effects are possible related to systemic progesterone, including but not limited to, headaches, breast tenderness, nausea, and irritability. While effective at preventing pregnancy long acting reversible contraceptives do not prevent transmission of sexually transmitted diseases and use of barrier methods for this purpose was discussed. The placement procedure for Nexplanon was reviewed with the  patient in detail including risks of nerve injury, infection, bleeding and injury to other muscles or tendons. She understands that the Nexplanon implant is good for 3 years and needs to be removed at the end of that time.  She understands that Nexplanon is an extremely effective option for contraception, with failure rate of <1%. This information is reviewed today and all questions were answered. Informed consent was obtained, both verbally and written.   The patient is healthy and has no contraindications to Implanon use. Urine pregnancy test was performed today and was negative.  Procedure Appropriate time out taken.  Patient placed in dorsal supine with left arm above head, elbow flexed at 90 degrees, arm resting on examination table.  The bicipital grove was palpated and site 8-10cm proximal to the medial epicondyle was indentified . The insertion site was prepped with a two betadine swabs and then injected with 1.5 cc of 1% lidocaine without epinephrine.  Nexplanon removed form sterile blister packaging,  Device confirmed in needle, before inserting full length of needle, tenting up the skin as the needle was advance.  The drug eluting rod was then deployed by pulling back the slider per the manufactures recommendation.  The implant was palpable by the clinician as well as the patient.  The insertion site covered dressed with a band aid before applying  a kerlex bandage pressure dressing..Minimal blood loss was noted during the procedure.  The patientt tolerated the procedure well.   She was instructed to wear the bandage for 24 hours, call with any signs of infection.  She was given the Implanon card and instructed to have the rod removed in 3 years.  She will RTC in one year for her next annual. Mirna Mires, Ina Homes  09/23/2020 4:23 PM

## 2020-09-25 LAB — CYTOLOGY - PAP
Chlamydia: NEGATIVE
Comment: NEGATIVE
Comment: NEGATIVE
Comment: NORMAL
Diagnosis: NEGATIVE
Neisseria Gonorrhea: NEGATIVE
Trichomonas: NEGATIVE

## 2020-10-02 ENCOUNTER — Encounter: Payer: Self-pay | Admitting: Obstetrics

## 2021-05-13 IMAGING — CT CT RENAL STONE PROTOCOL
2 of 4 series · 17 of 46 positions shown, 19 images · non-contrast
Comparison: None.

CLINICAL DATA: Acute right flank pain.

EXAM:
CT ABDOMEN AND PELVIS WITHOUT CONTRAST
TECHNIQUE: Multidetector CT imaging of the abdomen and pelvis was performed
following the standard protocol without IV contrast.

[Series 2: axial st · axial · 0.80mm/px · z∈[+898,+1293]mm · 14 of 89 slices shown, 16 images]
[im 5/89  soft-tissue]
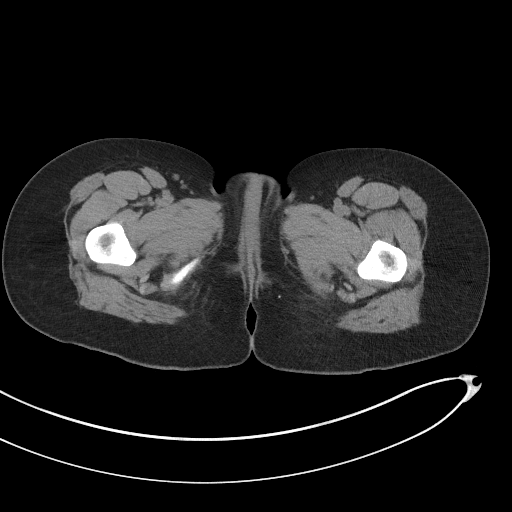
[im 5/89  bone]
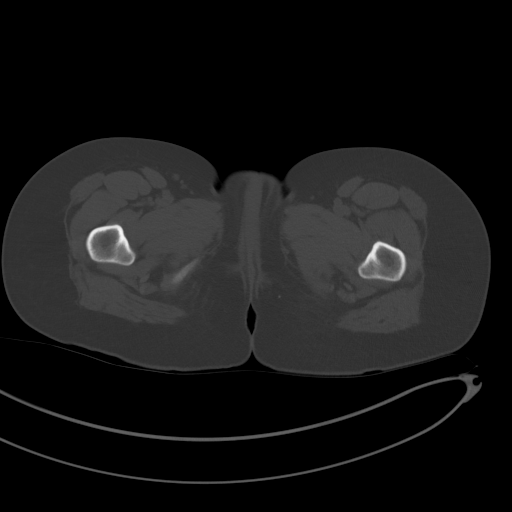
[im 10/89  soft-tissue]
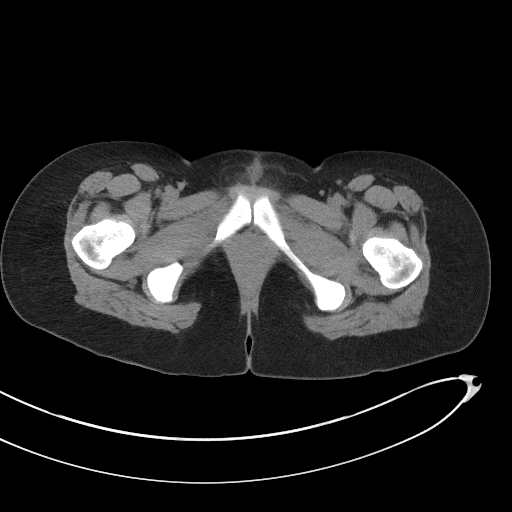
[im 19/89  soft-tissue]
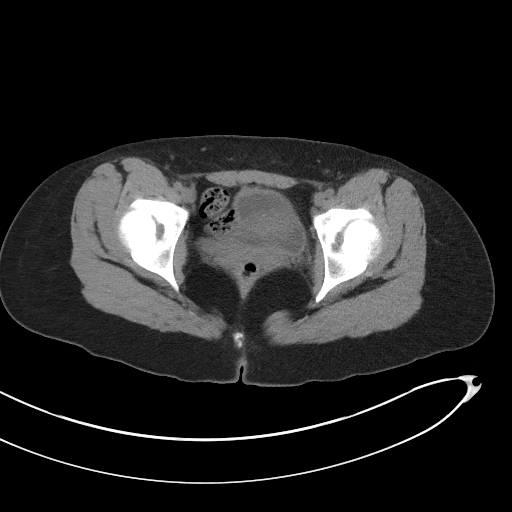
[im 24/89  soft-tissue]
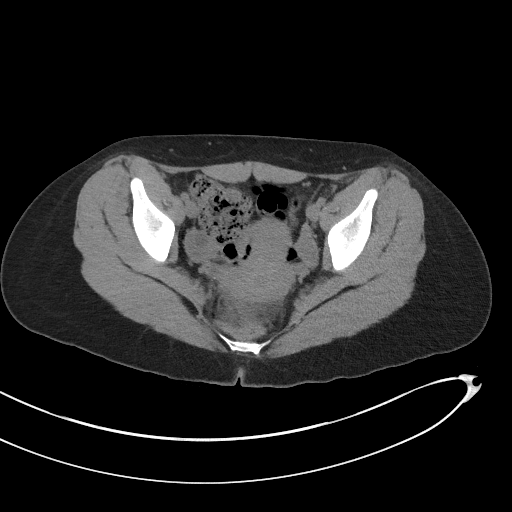
[im 28/89  soft-tissue]
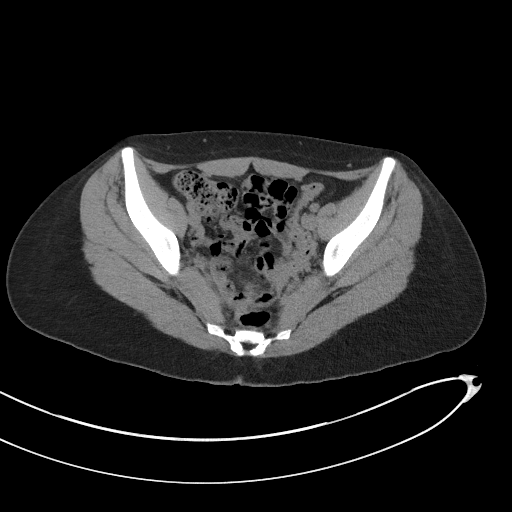
[im 38/89  soft-tissue]
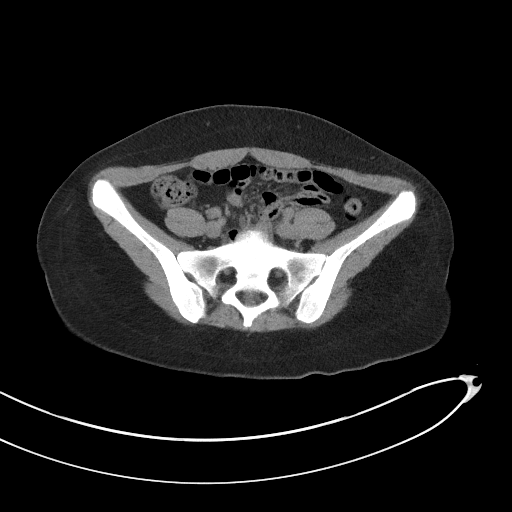
[im 42/89  soft-tissue]
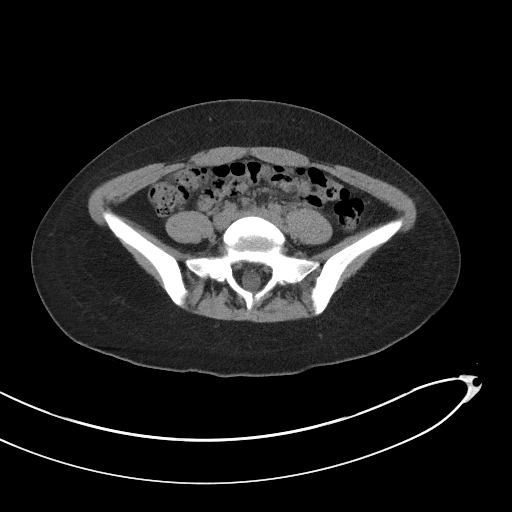
[im 47/89  soft-tissue]
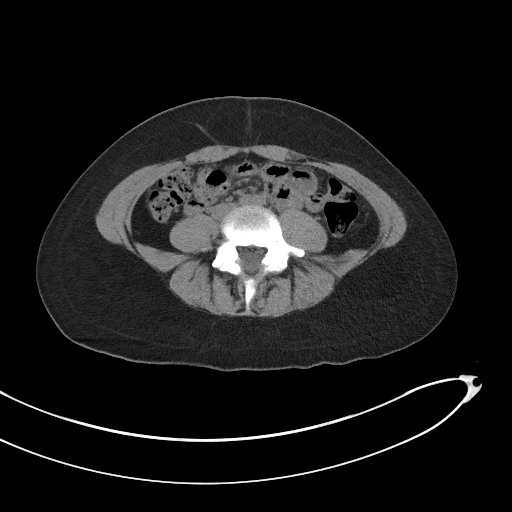
[im 51/89  soft-tissue]
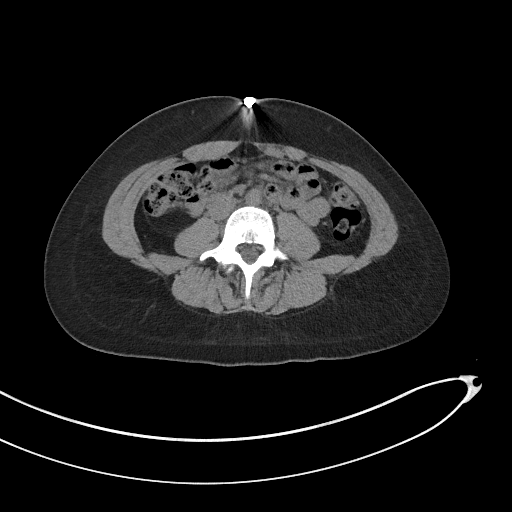
[im 51/89  bone]
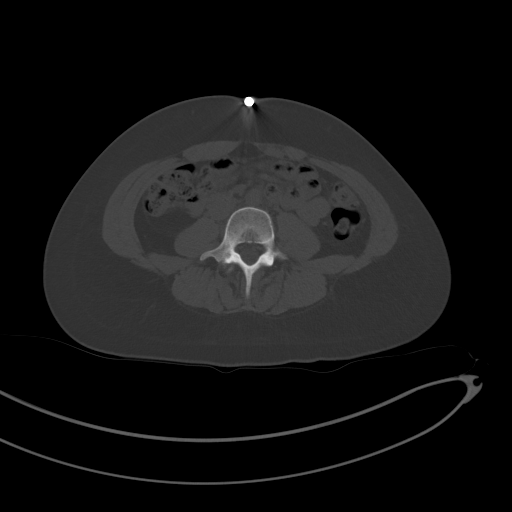
[im 61/89  soft-tissue]
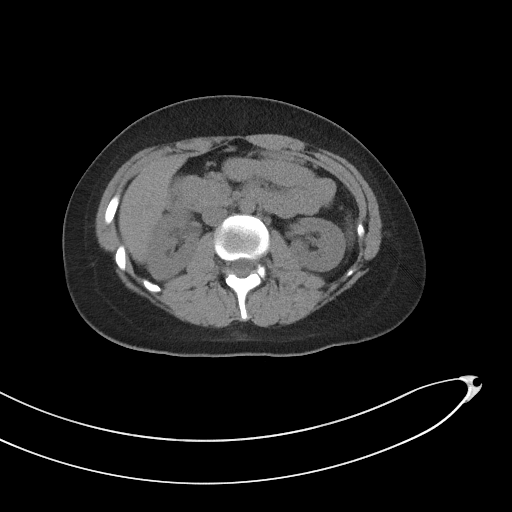
[im 65/89  soft-tissue]
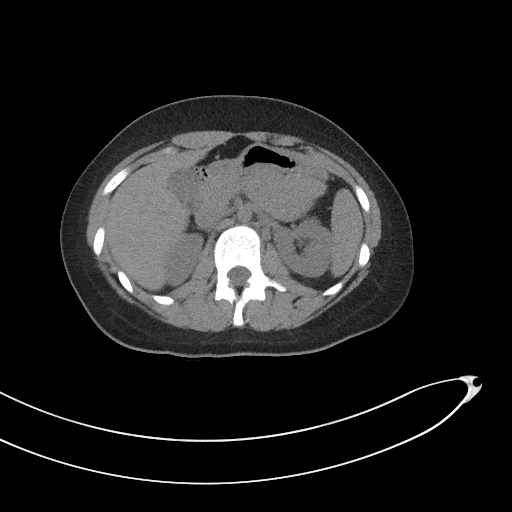
[im 70/89  soft-tissue]
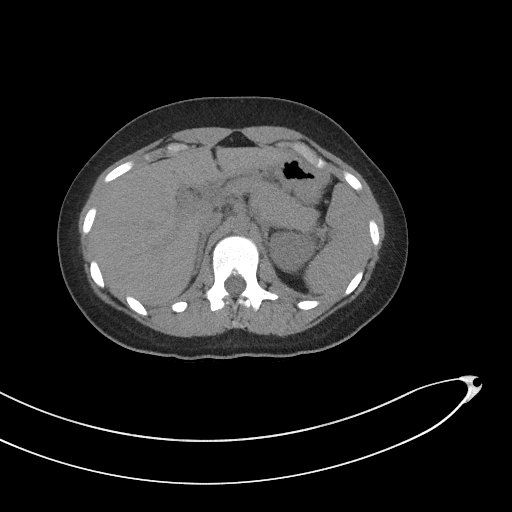
[im 79/89  soft-tissue]
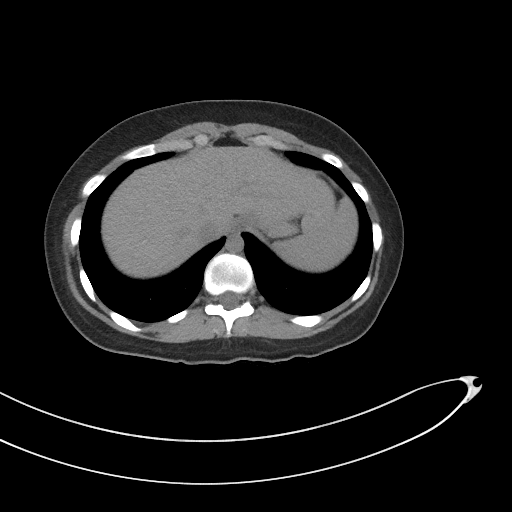
[im 84/89  soft-tissue]
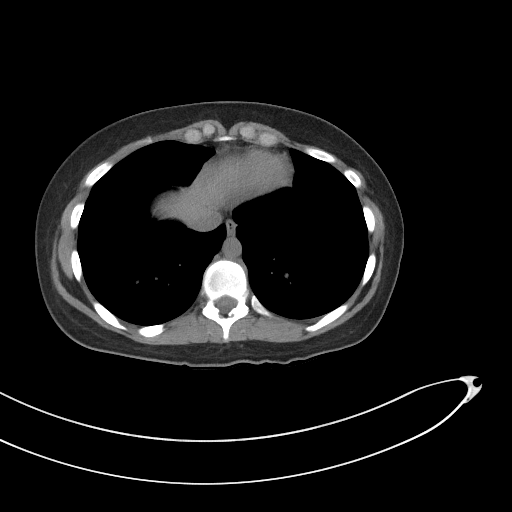

[Series 5: coronal st · coronal · 0.78mm/px · 3 of 87 slices shown]
[im 29/87  soft-tissue]
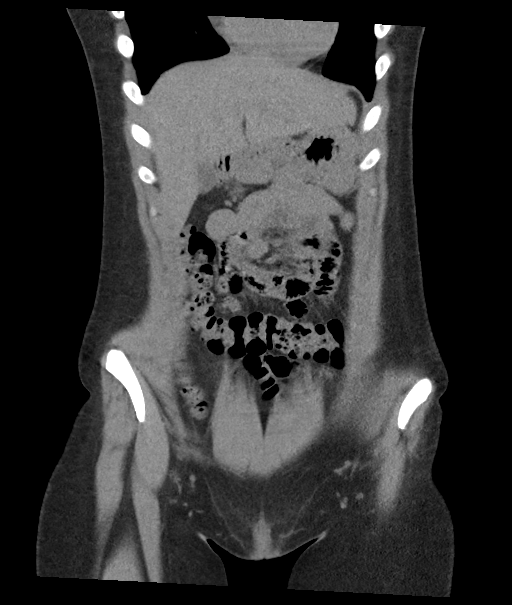
[im 39/87  soft-tissue]
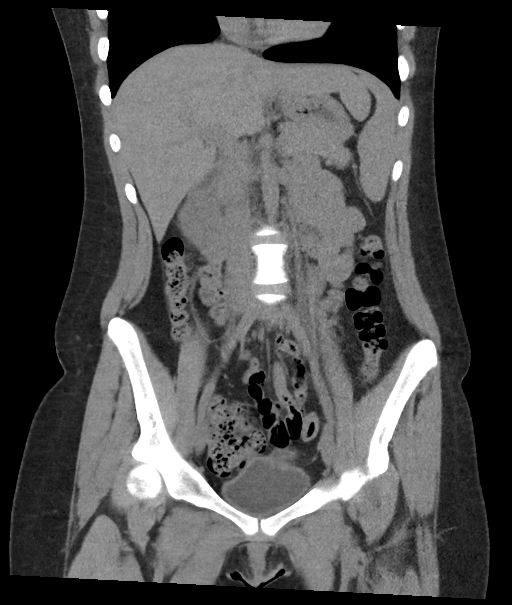
[im 48/87  soft-tissue]
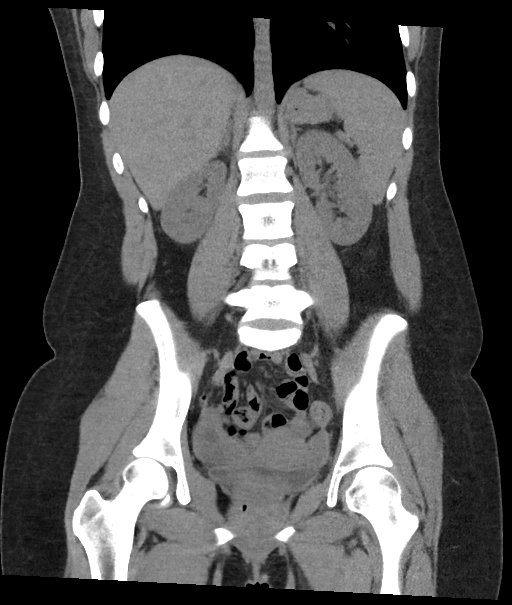

[17 of 46 positions shown; findings below may reference images not displayed]

FINDINGS: Lower chest: No acute abnormality.

Hepatobiliary: No focal liver abnormality is seen. No gallstones,
gallbladder wall thickening, or biliary dilatation.

Pancreas: Unremarkable. No pancreatic ductal dilatation or
surrounding inflammatory changes.

Spleen: Normal in size without focal abnormality.

Adrenals/Urinary Tract: Adrenal glands are unremarkable. Kidneys are
normal, without renal calculi, focal lesion, or hydronephrosis.
Bladder is unremarkable.

Stomach/Bowel: Stomach is within normal limits. Appendix appears
normal. No evidence of bowel wall thickening, distention, or
inflammatory changes.

Vascular/Lymphatic: No significant vascular findings are present. No
enlarged abdominal or pelvic lymph nodes.

Reproductive: Uterus and bilateral adnexa are unremarkable.

Other: No abdominal wall hernia or abnormality. No abdominopelvic
ascites.

Musculoskeletal: Bilateral L5 spondylolysis is noted. No acute
osseous abnormality is noted.
IMPRESSION: 1. Bilateral L5 spondylolysis.
2. No other abnormality seen in the abdomen or pelvis.

## 2021-08-07 ENCOUNTER — Ambulatory Visit
Admission: EM | Admit: 2021-08-07 | Discharge: 2021-08-07 | Disposition: A | Discharge status: Home / Self Care | Payer: BC Managed Care – PPO | Attending: Family Medicine | Admitting: Family Medicine

## 2021-08-07 ENCOUNTER — Encounter: Payer: Self-pay | Admitting: Emergency Medicine

## 2021-08-07 ENCOUNTER — Other Ambulatory Visit: Payer: Self-pay

## 2021-08-07 DIAGNOSIS — R52 Pain, unspecified: Secondary | ICD-10-CM

## 2021-08-07 DIAGNOSIS — Z1152 Encounter for screening for COVID-19: Secondary | ICD-10-CM

## 2021-08-07 DIAGNOSIS — R112 Nausea with vomiting, unspecified: Secondary | ICD-10-CM

## 2021-08-07 MED ORDER — ONDANSETRON 4 MG PO TBDP: 4.0000 mg | ORAL_TABLET | Freq: Three times a day (TID) | ORAL | 0 refills | Status: DC | PRN

## 2021-08-07 MED ORDER — ONDANSETRON 4 MG PO TBDP
4.0000 mg | ORAL_TABLET | Freq: Once | ORAL | Status: AC
  Administered 2021-08-07: 4 mg via ORAL

## 2021-08-07 NOTE — ED Triage Notes (Signed)
Pt c/o vomiting, bodyaches, and fever sxs started yesterday.

## 2021-08-07 NOTE — ED Provider Notes (Signed)
[facility]    CSN: [id number] Arrival date & time: 08/07/21  0820      History   Chief Complaint Chief Complaint  Patient presents with   Emesis   Fever   Generalized Body Aches    HPI Lori [name] is a 21 y.o. female.   Patient presenting today with 1 day history of generalized body aches, fever, chills, sweats, nausea, vomiting.  Denies significant cough, sore throat, congestion, chest pain, shortness of breath.  So far not trying anything over-the-counter for symptoms.  Not tolerating p.o. well at this time.  No known sick contacts recently.  No known pertinent chronic medical problems.  No concern for pregnancy, on implant for birth control.   Past Medical History:  Diagnosis Date   BV (bacterial vaginosis) 12/13/2014   Menstrual extraction 02/14/2014   UTI (urinary tract infection)    Vaginal discharge 12/13/2014    Patient Active Problem List   Diagnosis Date Noted   Irregular bleeding 08/29/2019   Nexplanon in place 02/26/2015   Vaginal discharge 12/13/2014   BV (bacterial vaginosis) 12/13/2014   Menstrual extraction 02/14/2014    History reviewed. No pertinent surgical history.  OB History     Gravida  0   Para  0   Term  0   Preterm  0   AB  0   Living  0      SAB  0   IAB  0   Ectopic  0   Multiple  0   Live Births               Home Medications    Prior to Admission medications   Medication Sig Start Date End Date Taking? Authorizing Provider  ondansetron (ZOFRAN-ODT) 4 MG disintegrating tablet Take 1 tablet (4 mg total) by mouth every 8 (eight) hours as needed for nausea or vomiting. 08/07/21  Yes Volney American, PA-C  etonogestrel (NEXPLANON) 68 MG IMPL implant 1 each by Subdermal route once.    [provider]    Family History Family History  Problem Relation Age of Onset   Thyroid disease Mother    Thyroid disease Maternal Grandmother     Social History Social History   Tobacco Use    Smoking status: Never   Smokeless tobacco: Never  Vaping Use   Vaping Use: Some days   Substances: Nicotine, Flavoring  Substance Use Topics   Alcohol use: No    Comment: occ   Drug use: No     Allergies   Macrobid [nitrofurantoin]   Review of Systems Review of Systems Per HPI  Physical Exam Triage Vital Signs ED Triage Vitals  Enc Vitals Group     BP 08/07/21 0943 131/89     Pulse Rate 08/07/21 0943 93     Resp 08/07/21 0943 18     Temp 08/07/21 0943 98.8 F (37.1 C)     Temp Source 08/07/21 0943 Oral     SpO2 08/07/21 0943 96 %     Weight --      Height --      Head Circumference --      Peak Flow --      Pain Score 08/07/21 0941 3     Pain Loc --      Pain Edu? --      Excl. in [place]? --    No data found.  Updated Vital Signs BP 131/89 (BP Location: Right Arm)    Pulse  93    Temp 98.8 F (37.1 C) (Oral)    Resp 18    SpO2 96%   Visual Acuity Right Eye Distance:   Left Eye Distance:   Bilateral Distance:    Right Eye Near:   Left Eye Near:    Bilateral Near:     Physical Exam Vitals and nursing note reviewed.  Constitutional:      Appearance: Normal appearance. She is not ill-appearing.  HENT:     Head: Atraumatic.     Right Ear: Tympanic membrane normal.     Left Ear: Tympanic membrane normal.     Nose: Nose normal.     Mouth/Throat:     Mouth: Mucous membranes are moist.     Pharynx: Oropharynx is clear.  Eyes:     Extraocular Movements: Extraocular movements intact.     Conjunctiva/sclera: Conjunctivae normal.  Cardiovascular:     Rate and Rhythm: Normal rate and regular rhythm.     Heart sounds: Normal heart sounds.  Pulmonary:     Effort: Pulmonary effort is normal.     Breath sounds: Normal breath sounds.  Abdominal:     General: Bowel sounds are normal. There is no distension.     Palpations: Abdomen is soft.     Tenderness: There is no abdominal tenderness. There is no guarding.  Musculoskeletal:        General: Normal range  of motion.     Cervical back: Normal range of motion and neck supple.  Skin:    General: Skin is warm and dry.  Neurological:     Mental Status: She is alert and oriented to person, place, and time.  Psychiatric:        Mood and Affect: Mood normal.        Thought Content: Thought content normal.        Judgment: Judgment normal.     UC Treatments / Results  Labs (all labs ordered are listed, but only abnormal results are displayed) Labs Reviewed  COVID-19, FLU A+B NAA    EKG   Radiology No results found.  Procedures Procedures (including critical care time)  Medications Ordered in UC Medications  ondansetron (ZOFRAN-ODT) disintegrating tablet 4 mg (has no administration in time range)    Initial Impression / Assessment and Plan / UC Course  I have reviewed the triage vital signs and the nursing notes.  Pertinent labs & imaging results that were available during my care of the patient were reviewed by me and considered in my medical decision making (see chart for details).     Vital signs benign and reassuring today, Zofran administered in clinic due to active nausea and vomiting during visit.  Zofran also sent to pharmacy for as needed use.  Brat diet, push fluids, cold and congestion medications as needed.  COVID and flu testing pending.  Work note given.  Return for acutely worsening symptoms.  Final Clinical Impressions(s) / UC Diagnoses   Final diagnoses:  Generalized body aches  Nausea and vomiting, unspecified vomiting type   Discharge Instructions   None    ED Prescriptions     Medication Sig Dispense Auth. Provider   ondansetron (ZOFRAN-ODT) 4 MG disintegrating tablet Take 1 tablet (4 mg total) by mouth every 8 (eight) hours as needed for nausea or vomiting. 20 tablet Volney American, [place]      PDMP not reviewed this encounter.   Volney American, [place] 08/07/21 1007

## 2021-08-08 LAB — COVID-19, FLU A+B NAA
Influenza A, NAA: NOT DETECTED
Influenza B, NAA: NOT DETECTED
SARS-CoV-2, NAA: NOT DETECTED

## 2021-11-10 ENCOUNTER — Ambulatory Visit: Payer: BC Managed Care – PPO | Admitting: Obstetrics

## 2021-11-11 ENCOUNTER — Ambulatory Visit: Payer: BC Managed Care – PPO | Admitting: Obstetrics

## 2021-11-11 VITALS — BP 120/74 | Ht 63.0 in

## 2021-11-11 DIAGNOSIS — Z3046 Encounter for surveillance of implantable subdermal contraceptive: Secondary | ICD-10-CM

## 2021-11-11 NOTE — Progress Notes (Addendum)
Lori [name] reports for the removal of her Nexplanon. This was placed about one year ago. She has had a number of serial implants and does not get a period. She would like to have this one removed as she believes that the implant contributes to some mood issues for her. Before she seeks behavioral health, she has opted a trial of "life without the Nexplanon". She is aware that she will begin to have regular periods. She plans on abstinence. ? ?BP 120/74   Ht 5\' 3"  (1.6 m)   BMI 23.67 kg/m?  ?Review of Systems  ?Constitutional: Negative.   ?HENT: Negative.    ?Eyes: Negative.   ?Respiratory: Negative.    ?Cardiovascular: Negative.   ?Skin: Negative.   ?All other systems reviewed and are negative. ?Physical Exam ?Constitutional:   ?   Appearance: Normal appearance. She is normal weight.  ?HENT:  ?   Head: Normocephalic and atraumatic.  ?Cardiovascular:  ?   Rate and Rhythm: Normal rate and regular rhythm.  ?Pulmonary:  ?   Effort: Pulmonary effort is normal.  ?   Breath sounds: Normal breath sounds.  ?Abdominal:  ?   General: Abdomen is flat.  ?   Palpations: Abdomen is soft.  ?Genitourinary: ?   Comments: Deferred. ?Musculoskeletal:     ?   General: Normal range of motion.  ?Skin: ?   General: Skin is warm and dry.  ?Neurological:  ?   Mental Status: She is alert.  ?Psychiatric:     ?   Mood and Affect: Mood normal.     ?   Behavior: Behavior normal.  ?  ?A: Encounter for Nexplanon removal ?  ?GYNECOLOGY PROCEDURE NOTE ? ?Nexplanon removal discussed in detail.  Risks of infection, bleeding, nerve injury all reviewed.  Patient understands risks and desires to proceed.  Verbal consent obtained.  Patient is certain she wants the Nexplanon removed.  All questions answered. ? ?Procedure: ?Patient placed in dorsal supine with left arm above head, elbow flexed at 90 degrees, arm resting on examination table.  Nexplanon identified without problems.  Betadine scrub x3.  1 ml of 1% lidocaine injected under Nexplanondevice  without problems.  Sterile gloves applied.  Small 0.5cm incision made at distal tip of Nexplanon device with 11 blade scalpel.  Nexplanon brought to incision and grasped with a small kelly clamp.  Nexplanon removed intact without problems.  Pressure applied to incision.  Hemostasis obtained.  Steri-strips applied, followed by bandage and compression dressing.  Patient tolerated procedure well.  No complications. ? ? ?Assessment: ?21 y.o. year old female now s/p uncomplicated Nexplanon removal. ? ?Plan: ?1.  Patient given post procedure precautions and asked to call for fever, chills, redness or drainage from her incision, bleeding from incision.  She understands she will likely have a small bruise near site of removal and can remove bandage tomorrow and steri-strips in approximately 1 week. ? ?2) Contraception-abstinence  ? ? ?3) She is due for an annual Gyn PE, and will schedule. ? ?21, CNM  ?11/11/2021 3:30 PM  ? ?

## 2022-04-06 DIAGNOSIS — N926 Irregular menstruation, unspecified: Secondary | ICD-10-CM | POA: Diagnosis not present

## 2022-04-06 DIAGNOSIS — Z03818 Encounter for observation for suspected exposure to other biological agents ruled out: Secondary | ICD-10-CM | POA: Diagnosis not present

## 2022-04-06 DIAGNOSIS — R0989 Other specified symptoms and signs involving the circulatory and respiratory systems: Secondary | ICD-10-CM | POA: Diagnosis not present

## 2022-09-06 NOTE — Progress Notes (Unsigned)
   PCP:  Cory Munch, PA-C   No chief complaint on file.    HPI:      Ms. Lori [name] is a 22 y.o. G0P0000 whose LMP was No LMP recorded. Patient has had an implant., presents today for her annual examination.  Her menses are {norm/abn:715}, lasting {number: 22536} days.  Dysmenorrhea {dysmen:716}. She {does:18564} have intermenstrual bleeding.  Sex activity: {sex active: 315163}. Nexplanon removed 4/23 Last Pap: 09/23/20  Results were: no abnormalities /neg HPV DNA  Hx of STDs: {STD hx:14358}  There is no FH of breast cancer. There is no FH of ovarian cancer. The patient {does:18564} do self-breast exams.  Tobacco use: {tob:20664} Alcohol use: {Alcohol:11675} No drug use.  Exercise: {exercise:31265}  She {does:18564} get adequate calcium and Vitamin D in her diet.  Patient Active Problem List   Diagnosis Date Noted   Irregular bleeding 08/29/2019   Vaginal discharge 12/13/2014   BV (bacterial vaginosis) 12/13/2014   Menstrual extraction 02/14/2014    No past surgical history on file.  Family History  Problem Relation Age of Onset   Thyroid disease Mother    Thyroid disease Maternal Grandmother     Social History   Socioeconomic History   Marital status: Single    Spouse name: Not on file   Number of children: Not on file   Years of education: Not on file   Highest education level: Not on file  Occupational History   Not on file  Tobacco Use   Smoking status: Never   Smokeless tobacco: Never  Vaping Use   Vaping Use: Some days   Substances: Nicotine, Flavoring  Substance and Sexual Activity   Alcohol use: No    Comment: occ   Drug use: No   Sexual activity: Not Currently    Birth control/protection: Implant  Other Topics Concern   Not on file  Social History Narrative   Not on file   Social Determinants of Health   Financial Resource Strain: Not on file  Food Insecurity: Not on file  Transportation Needs: Not on file  Physical Activity:  Not on file  Stress: Not on file  Social Connections: Not on file  Intimate Partner Violence: Not on file     Current Outpatient Medications:    ondansetron (ZOFRAN-ODT) 4 MG disintegrating tablet, Take 1 tablet (4 mg total) by mouth every 8 (eight) hours as needed for nausea or vomiting. (Patient not taking: Reported on 11/11/2021), Disp: 20 tablet, Rfl: 0     ROS:  Review of Systems BREAST: No symptoms   Objective: There were no vitals taken for this visit.   OBGyn Exam  Results: No results found for this or any previous visit (from the past 24 hour(s)).  Assessment/Plan: No diagnosis found.  No orders of the defined types were placed in this encounter.            GYN counsel {counseling: 16159}     F/U  No follow-ups on file.  Kazuma Elena B. Tesia Lybrand, PA-C 09/06/2022 5:22 PM

## 2022-09-07 ENCOUNTER — Other Ambulatory Visit (HOSPITAL_COMMUNITY)
Admission: RE | Admit: 2022-09-07 | Discharge: 2022-09-07 | Disposition: A | Discharge status: Home / Self Care | Payer: BC Managed Care – PPO | Source: Ambulatory Visit | Attending: Obstetrics and Gynecology | Admitting: Obstetrics and Gynecology

## 2022-09-07 ENCOUNTER — Ambulatory Visit (INDEPENDENT_AMBULATORY_CARE_PROVIDER_SITE_OTHER): Payer: BC Managed Care – PPO | Admitting: Obstetrics and Gynecology

## 2022-09-07 ENCOUNTER — Encounter: Payer: Self-pay | Admitting: Obstetrics and Gynecology

## 2022-09-07 VITALS — BP 110/70 | Ht 63.0 in | Wt 158.0 lb

## 2022-09-07 DIAGNOSIS — Z01419 Encounter for gynecological examination (general) (routine) without abnormal findings: Secondary | ICD-10-CM

## 2022-09-07 DIAGNOSIS — Z30017 Encounter for initial prescription of implantable subdermal contraceptive: Secondary | ICD-10-CM | POA: Diagnosis not present

## 2022-09-07 DIAGNOSIS — Z113 Encounter for screening for infections with a predominantly sexual mode of transmission: Secondary | ICD-10-CM | POA: Diagnosis not present

## 2022-09-07 MED ORDER — ETONOGESTREL 68 MG ~~LOC~~ IMPL
68.0000 mg | DRUG_IMPLANT | Freq: Once | SUBCUTANEOUS | Status: AC
  Administered 2022-09-07: 68 mg via SUBCUTANEOUS

## 2022-09-07 NOTE — Patient Instructions (Addendum)
I value your feedback and you entrusting us with your care. If you get a [facility] patient survey, I would appreciate you taking the time to let us know about your experience today. Thank you!  Remove the dressing in 24 hours,  keep the incision area dry for 24 hours and remove the Steristrip in 2-3  days.  Notify us if any signs of tenderness, redness, pain, or fevers develop.   

## 2022-09-08 ENCOUNTER — Encounter: Payer: Self-pay | Admitting: Obstetrics and Gynecology

## 2022-09-08 LAB — CERVICOVAGINAL ANCILLARY ONLY
Chlamydia: NEGATIVE
Comment: NEGATIVE
Comment: NORMAL
Neisseria Gonorrhea: NEGATIVE

## 2022-09-11 ENCOUNTER — Other Ambulatory Visit: Payer: Self-pay

## 2022-09-11 MED ORDER — FLUCONAZOLE 150 MG PO TABS: 150.0000 mg | ORAL_TABLET | Freq: Once | ORAL | 0 refills | Status: AC

## 2022-09-11 NOTE — Progress Notes (Signed)
Patient called she has yeast infection. She has white discharge and vaginal itching. She has an appointment coming up for contraception management and an annual exam. I went ahead and sent 1 Diflucan to her pharmacy.

## 2022-10-15 DIAGNOSIS — N390 Urinary tract infection, site not specified: Secondary | ICD-10-CM | POA: Diagnosis not present

## 2023-06-16 DIAGNOSIS — N3000 Acute cystitis without hematuria: Secondary | ICD-10-CM | POA: Diagnosis not present

## 2023-06-28 ENCOUNTER — Encounter: Payer: Self-pay | Admitting: Obstetrics

## 2023-06-28 ENCOUNTER — Ambulatory Visit: Payer: BC Managed Care – PPO | Admitting: Obstetrics

## 2023-06-28 VITALS — BP 111/76 | HR 81 | Ht 64.0 in | Wt 146.0 lb

## 2023-06-28 DIAGNOSIS — F419 Anxiety disorder, unspecified: Secondary | ICD-10-CM | POA: Diagnosis not present

## 2023-06-28 DIAGNOSIS — F411 Generalized anxiety disorder: Secondary | ICD-10-CM

## 2023-06-28 MED ORDER — ESCITALOPRAM OXALATE 10 MG PO TABS: 10.0000 mg | ORAL_TABLET | Freq: Every day | ORAL | 1 refills | Status: DC

## 2023-06-28 NOTE — Progress Notes (Signed)
Established Patient Office Visit  Subjective   Patient ID: Lori [name], female    DOB: [birth year]  Age: 22 y.o. MRN: [id number]  Chief Complaint  Patient presents with   Contraception    Depression/Anxiety with Nexplanon   Lori [name] presents today to discuss symptoms of anxiety. She has brought this up at previous visits. Her parents both use medication for mood issues. Lori [name] was concerned that her Nexplanon was contributing to her anxiety, which she describes as recurrent panicky episodes ( several per week) that also involve feeling like her heart is racing. She "over thinks" things at work ( she is in Phot[employer] with [name]).  She would like to have a trial of medication. She does not presently have a PCP.  Is not in counseling either. In the past she tried Buspar and Fluoxetine- she shares that the prozac "jacked up up".  Her family members hae used Lexapro successfully. HPI    Review of Systems  Constitutional: Negative.   HENT: Negative.    Eyes: Negative.   Respiratory: Negative.    Cardiovascular: Negative.   Gastrointestinal: Negative.   Genitourinary: Negative.   Musculoskeletal: Negative.   Skin: Negative.   Neurological: Negative.   Endo/Heme/Allergies: Negative.   Psychiatric/Behavioral:  The patient is nervous/anxious.        PHQ score 10; GAD is 13      Objective:     BP 111/76   Pulse 81   Ht 5\' 4"  (1.626 m)   Wt 146 lb (66.2 kg)   LMP 10/29/2022 (Approximate)   BMI 25.06 kg/m    Physical Exam Constitutional:      Appearance: Normal appearance. She is normal weight.  HENT:     Head: Normocephalic and atraumatic.  Cardiovascular:     Rate and Rhythm: Normal rate and regular rhythm.     Pulses: Normal pulses.     Heart sounds: Normal heart sounds.  Pulmonary:     Effort: Pulmonary effort is normal.     Breath sounds: Normal breath sounds.  Abdominal:     Palpations: Abdomen is soft.  Musculoskeletal:     Cervical back: Normal range of  motion and neck supple.  Neurological:     General: No focal deficit present.     Mental Status: She is alert and oriented to person, place, and time.  Psychiatric:     Comments: PHQ score 10  GAD score : 13.      No results found for any visits on 06/28/23.    The ASCVD Risk score (Arnett DK, et al., 2019) failed to calculate for the following reasons:   The 2019 ASCVD risk score is only valid for ages 67 to 30    Assessment & Plan:  Symptoms of anxiety- counsel on medication management.   Plan: discussed a trial of SSRI medication initially. She is also encouraged to establish care with a PCP. We discussed the benefits of medication as well as getting adequate sleep, exposure to sunshine and  having a basic fitness plan.  After reviewing the common side effects of SSRI medications, we mutually agreed to a start with Lexapro 10 mg po daily.she will RTC in 4 weeks for follow up. I have reminded her that in the future, she might have the dosage adusted or additional mediations added , most likely by a specialist if she requires dual meds.  F.U in 4 weeks.  Mirna Mires, CNM  06/28/2023 12:25 PM  Rocco Serene, LPN

## 2023-06-28 NOTE — Addendum Note (Signed)
Addended by: Mirna Mires on: 06/28/2023 01:51 PM   Modules accepted: Orders

## 2023-07-27 DIAGNOSIS — N390 Urinary tract infection, site not specified: Secondary | ICD-10-CM | POA: Diagnosis not present

## 2023-08-03 ENCOUNTER — Ambulatory Visit: Payer: BC Managed Care – PPO | Admitting: Obstetrics

## 2023-08-03 VITALS — BP 131/84 | HR 73 | Wt 148.3 lb

## 2023-08-03 DIAGNOSIS — F411 Generalized anxiety disorder: Secondary | ICD-10-CM | POA: Diagnosis not present

## 2023-08-03 DIAGNOSIS — Z79899 Other long term (current) drug therapy: Secondary | ICD-10-CM

## 2023-08-03 MED ORDER — ESCITALOPRAM OXALATE 20 MG PO TABS: 20.0000 mg | ORAL_TABLET | Freq: Every day | ORAL | 3 refills | Status: DC

## 2023-08-03 NOTE — Progress Notes (Signed)
 Obstetrics & Gynecology Office Visit   Chief Complaint:  Chief Complaint  Patient presents with   Follow-up    Medication follow up     History of Present Illness: Lori [name] present s for medication follow up. She was started on Lexapro  10 mg po daily  to address anxiety symptoms that she has struggled with for a number of years. Her family hx is noted for GAD ( mother).  Today, she is smiling and declares that she is doing very well on the medication, and really happy that most of her anxiety SXS have been eliminated. She does report some occasional worrisome moments.she is wondering about increasing the dose. Initially,  she had some restless legs for the first few days after starting the medication, but this stopped after a few days.  She works at a midwife. Review of Systems:  Review of Systems  Constitutional: Negative.   HENT: Negative.    Eyes: Negative.   Respiratory: Negative.    Cardiovascular: Negative.   Gastrointestinal: Negative.   Genitourinary: Negative.   Musculoskeletal: Negative.   Skin: Negative.   Neurological: Negative.   Endo/Heme/Allergies: Negative.   Psychiatric/Behavioral: Negative.         PHQ 0 GAD 3     Past Medical History:  Past Medical History:  Diagnosis Date   BV (bacterial vaginosis) 12/13/2014   Menstrual extraction 02/14/2014   UTI (urinary tract infection)    Vaginal discharge 12/13/2014    Past Surgical History:  Past Surgical History:  Procedure Laterality Date   WISDOM TOOTH EXTRACTION      Gynecologic History: No LMP recorded. Patient has had an implant.  Obstetric History: G0P0000  Family History:  Family History  Problem Relation Age of Onset   Thyroid disease Mother    Thyroid disease Maternal Grandmother    Colon cancer Maternal Great-grandmother        twice, 36s    Social History:  Social History   Socioeconomic History   Marital status: Single    Spouse name: Not on file   Number of children: Not  on file   Years of education: Not on file   Highest education level: Not on file  Occupational History   Not on file  Tobacco Use   Smoking status: Never   Smokeless tobacco: Never  Vaping Use   Vaping status: Some Days   Substances: Nicotine, Flavoring  Substance and Sexual Activity   Alcohol use: No    Comment: occ   Drug use: No   Sexual activity: Not Currently    Birth control/protection: None  Other Topics Concern   Not on file  Social History Narrative   Not on file   Social Drivers of Health   Financial Resource Strain: Not on file  Food Insecurity: Not on file  Transportation Needs: Not on file  Physical Activity: Not on file  Stress: Not on file  Social Connections: Unknown (12/16/2021)   Received from [facility]   Social Network    Social Network: Not on file  Intimate Partner Violence: Unknown (11/07/2021)   Received from [facility], [facility]   HITS    Physically Hurt: Not on file    Insult or Talk Down To: Not on file    Threaten Physical Harm: Not on file    Scream or Curse: Not on file    Allergies:  Allergies  Allergen Reactions   Macrobid [Nitrofurantoin] Nausea And Vomiting    Medications:  Prior to Admission medications   Medication Sig Start Date End Date Taking? Authorizing Provider  escitalopram  (LEXAPRO ) 10 MG tablet Take 1 tablet (10 mg total) by mouth daily. 06/28/23  Yes Carlin Rollene HERO, CNM    Physical Exam Vitals:  Vitals:   08/03/23 1411  BP: 131/84  Pulse: 73   No LMP recorded. Patient has had an implant.  Physical Exam Constitutional:      Appearance: Normal appearance.  HENT:     Head: Normocephalic and atraumatic.  Cardiovascular:     Rate and Rhythm: Normal rate and regular rhythm.     Pulses: Normal pulses.     Heart sounds: Normal heart sounds.  Pulmonary:     Effort: Pulmonary effort is normal.     Breath sounds: Normal breath sounds.  Genitourinary:    Comments:  deferred Musculoskeletal:        General: Normal range of motion.     Cervical back: Normal range of motion and neck supple.  Skin:    General: Skin is warm and dry.  Neurological:     General: No focal deficit present.     Mental Status: She is alert and oriented to person, place, and time.  Psychiatric:        Mood and Affect: Mood normal.        Behavior: Behavior normal.      Assessment: 22 y.o. G0P0000 for anxiety medication follow up GAD Plan: Problem List Items Addressed This Visit   None  We discussed whether a dose increase might eliminate the few occasional SXS. She would like to increase her dose to 20 mg daily. Will increase this, but she will RTC in March for her next annual and will follow up with the provider then on her lexapro .  Rollene HERO Carlin, CNM  08/03/2023 4:39 PM

## 2023-08-18 ENCOUNTER — Telehealth: Payer: Self-pay

## 2023-08-18 NOTE — Telephone Encounter (Signed)
 Pt calling triage to see if a yeast Rx can be called in. Advised her to schedule an appt or try 7 day monistat first. She will try monistat first then f/u as needed.

## 2023-09-23 ENCOUNTER — Ambulatory Visit: Payer: BC Managed Care – PPO | Admitting: Obstetrics

## 2023-09-23 ENCOUNTER — Encounter: Payer: Self-pay | Admitting: Obstetrics

## 2023-09-23 ENCOUNTER — Other Ambulatory Visit (HOSPITAL_COMMUNITY)
Admission: RE | Admit: 2023-09-23 | Discharge: 2023-09-23 | Disposition: A | Discharge status: Home / Self Care | Payer: BC Managed Care – PPO | Source: Ambulatory Visit | Attending: Obstetrics | Admitting: Obstetrics

## 2023-09-23 VITALS — BP 116/69 | HR 72 | Ht 65.0 in | Wt 148.0 lb

## 2023-09-23 DIAGNOSIS — Z13220 Encounter for screening for lipoid disorders: Secondary | ICD-10-CM

## 2023-09-23 DIAGNOSIS — Z1159 Encounter for screening for other viral diseases: Secondary | ICD-10-CM

## 2023-09-23 DIAGNOSIS — Z3200 Encounter for pregnancy test, result unknown: Secondary | ICD-10-CM

## 2023-09-23 DIAGNOSIS — Z113 Encounter for screening for infections with a predominantly sexual mode of transmission: Secondary | ICD-10-CM

## 2023-09-23 DIAGNOSIS — Z124 Encounter for screening for malignant neoplasm of cervix: Secondary | ICD-10-CM

## 2023-09-23 DIAGNOSIS — Z79899 Other long term (current) drug therapy: Secondary | ICD-10-CM

## 2023-09-23 DIAGNOSIS — F411 Generalized anxiety disorder: Secondary | ICD-10-CM

## 2023-09-23 DIAGNOSIS — Z131 Encounter for screening for diabetes mellitus: Secondary | ICD-10-CM

## 2023-09-23 DIAGNOSIS — Z01419 Encounter for gynecological examination (general) (routine) without abnormal findings: Secondary | ICD-10-CM | POA: Diagnosis not present

## 2023-09-23 DIAGNOSIS — Z3202 Encounter for pregnancy test, result negative: Secondary | ICD-10-CM | POA: Diagnosis not present

## 2023-09-23 DIAGNOSIS — Z23 Encounter for immunization: Secondary | ICD-10-CM | POA: Diagnosis not present

## 2023-09-23 LAB — POCT URINE PREGNANCY: Preg Test, Ur: NEGATIVE

## 2023-09-23 MED ORDER — ESCITALOPRAM OXALATE 20 MG PO TABS: 20.0000 mg | ORAL_TABLET | Freq: Every day | ORAL | 3 refills | Status: DC

## 2023-09-23 NOTE — Progress Notes (Signed)
 GYNECOLOGY: ANNUAL EXAM   Subjective:    PCP: Shawnie Dapper, PA-C Lori [name] is a 23 y.o. female G0P0000 who presents for annual wellness visit and medication f/up on Lexapro.pt request 60-90 day rx of lexapro.   Well Woman Visit:  GYN HISTORY:  Patient's last menstrual period was 09/16/2023.     Menstrual History: OB History     Gravida  0   Para  0   Term  0   Preterm  0   AB  0   Living  0      SAB  0   IAB  0   Ectopic  0   Multiple  0   Live Births              Menarche age: 54 Patient's last menstrual period was 09/16/2023. Nexplanon This was the first period since having the nexplanon placed.  Urinary incontinence? no  Sexually active: yes  Number of sexual partners: 1  Gender of sexual Partners: male  Social History   Substance and Sexual Activity  Sexual Activity Yes   Birth control/protection: None   Contraceptive methods:  nexplanon 09/07/22 Dyspareunia? no STI history: no STI/HIV testing or immunizations needed? Yes.     Health Maintenance: -Last pap: approximate date 09/23/20 and was normal  --> Any abnormals: no -FMH of Breast / Colon / Cervical cancer: colon mgm -Vaccines:  Immunization History  Administered Date(s) Administered   HPV 9-valent 09/23/2023    Last Tdap: n/a / Flu: declined / COVID: declined / Gardasil: today /Shingles (50+): n/a / PCV20: n/a -Hep C screen: today  -Last lipid / glucose screening: today   > Exercise: none, moderately active > Dietary Supplements: Folate: No;  Calcium: No; Vitamin D: No > Body mass index is 24.63 kg/m.  > Recent dental visit No. > Seat Belt Use: Yes.   > Texting and driving? No. > Guns in the house Yes.   > Recreational or other drug use: denied.   Social History   Tobacco Use   Smoking status: Never   Smokeless tobacco: Never  Substance Use Topics   Alcohol use: No    Comment: occ   Occupation: bank     Lives with: self    PHQ-2 Score: In last  two weeks, how often have you felt: Little interest or pleasure in doing things: Not at all (0) Feeling down, depressed or hopeless: Not at all (0) Score: 0  GAD-2 Over the last 2 weeks, how often have you been bothered by the following problems? Feeling nervous, anxious or on edge: Not at all (0) Not being able to stop or control worrying: Not at all (0)} Score: 0 _________________________________________________________  Current Outpatient Medications  Medication Sig Dispense Refill   escitalopram (LEXAPRO) 20 MG tablet Take 1 tablet (20 mg total) by mouth at bedtime. 30 tablet 3   etonogestrel (NEXPLANON) 68 MG IMPL implant 1 each by Subdermal route once.     No current facility-administered medications for this visit.   Allergies  Allergen Reactions   Macrobid [Nitrofurantoin] Nausea And Vomiting    Past Medical History:  Diagnosis Date   BV (bacterial vaginosis) 12/13/2014   Menstrual extraction 02/14/2014   UTI (urinary tract infection)    Vaginal discharge 12/13/2014   Past Surgical History:  Procedure Laterality Date   WISDOM TOOTH EXTRACTION      Review Of Systems  Constitutional: Denied constitutional symptoms, night sweats, recent illness, fatigue, fever, insomnia  and weight loss.  Eyes: Denied eye symptoms, eye pain, photophobia, vision change and visual disturbance.  Ears/Nose/Throat/Neck: Denied ear, nose, throat or neck symptoms, hearing loss, nasal discharge, sinus congestion and sore throat.  Cardiovascular: Denied cardiovascular symptoms, arrhythmia, chest pain/pressure, edema, exercise intolerance, orthopnea and palpitations.  Respiratory: Denied pulmonary symptoms, asthma, pleuritic pain, productive sputum, cough, dyspnea and wheezing.  Gastrointestinal: Denied, gastro-esophageal reflux, melena, nausea and vomiting.  Genitourinary: Denied genitourinary symptoms including symptomatic vaginal discharge, pelvic relaxation issues, and urinary complaints.   Musculoskeletal: Denied musculoskeletal symptoms, stiffness, swelling, muscle weakness and myalgia.  Dermatologic: Denied dermatology symptoms, rash and scar.  Neurologic: Denied neurology symptoms, dizziness, headache, neck pain and syncope.  Psychiatric: Denied psychiatric symptoms, anxiety and depression.  Endocrine: Denied endocrine symptoms including hot flashes and night sweats.      Objective:    BP 116/69   Pulse 72   Ht 5\' 5"  (1.651 m)   Wt 148 lb (67.1 kg)   LMP 09/16/2023   BMI 24.63 kg/m   Constitutional: Well-developed, well-nourished female in no acute distress Neurological: Alert and oriented to person, place, and time Psychiatric: Mood and affect appropriate Skin: No rashes or lesions Neck: Supple without masses. Trachea is midline.Thyroid is normal size without masses Lymphatics: No cervical, axillary, supraclavicular, or inguinal adenopathy noted Respiratory: Clear to auscultation bilaterally. Good air movement with normal work of breathing. Cardiovascular: Regular rate and rhythm. Extremities grossly normal, nontender with no edema; pulses regular Gastrointestinal: Soft, nontender, nondistended. No masses or hernias appreciated. No hepatosplenomegaly. No fluid wave. No rebound or guarding. Breast Exam: normal appearance, no masses or tenderness, Inspection negative, No nipple retraction or dimpling, No nipple discharge or bleeding, No axillary or supraclavicular adenopathy Genitourinary:         External Genitalia: Normal female genitalia    Vagina: Normal mucosa, no lesions.    Cervix: No lesions, normal size and consistency; no cervical motion tenderness; non-friable; Pap obtained.    Uterus: Normal size and contour; smooth, mobile, NT. Adnexae: Non-palpable and non-tender Perineum/Anus: No lesions Rectal: deferred    Assessment/Plan:    Lori [name] is a 23 y.o. female G0P0000 with normal well-woman gynecologic exam.  -Screenings:  Pap: done  w/rflx today Labs: STI, A1C, HepC, Lipid panel, Vit D, TSH GAD/PHQ-2 = 0 -Contraception: Nexplanon; due for replacement 09/2025 -Vaccines: Gardasil #1 given today -Healthy lifestyle modifications discussed: multivitamin, diet, exercise, sunscreen, tobacco and alcohol use. Emphasized importance of regular physical activity.  -Folate recommendation reviewed.  -All questions answered to patient's satisfaction.   Anxiety:  -Stable on Lexapro 20mg , refilled x 12 mos  Return in about 1 year (around 09/22/2024) for Annual.    Julieanne Manson, DO [facility] at [facility]

## 2023-09-23 NOTE — Patient Instructions (Signed)

## 2023-09-24 ENCOUNTER — Encounter: Payer: Self-pay | Admitting: Obstetrics

## 2023-09-24 LAB — LIPID PANEL
Chol/HDL Ratio: 3.7 {ratio} (ref 0.0–4.4)
Cholesterol, Total: 243 mg/dL — ABNORMAL HIGH (ref 100–199)
HDL: 66 mg/dL (ref >39)
LDL Chol Calc (NIH): 165 mg/dL — ABNORMAL HIGH (ref 0–99)
Triglycerides: 70 mg/dL (ref 0–149)
VLDL Cholesterol Cal: 12 mg/dL (ref 5–40)

## 2023-09-24 LAB — HIV ANTIBODY (ROUTINE TESTING W REFLEX): HIV Screen 4th Generation wRfx: NONREACTIVE

## 2023-09-24 LAB — HEPATITIS C ANTIBODY: Hep C Virus Ab: NONREACTIVE

## 2023-09-24 LAB — HEMOGLOBIN A1C
Est. average glucose Bld gHb Est-mCnc: 91 mg/dL
Hgb A1c MFr Bld: 4.8 % (ref 4.8–5.6)

## 2023-09-29 ENCOUNTER — Ambulatory Visit: Payer: BC Managed Care – PPO | Admitting: Obstetrics

## 2023-09-30 LAB — CYTOLOGY - PAP
Chlamydia: NEGATIVE
Comment: NEGATIVE
Comment: NORMAL
Neisseria Gonorrhea: NEGATIVE

## 2023-10-01 ENCOUNTER — Telehealth: Payer: Self-pay

## 2023-10-01 NOTE — Telephone Encounter (Signed)
 Pt called triage about her pap results. LSIL. She googled it and says its related to HPV. I advised her her pap did not have HPV testing on it Pt states she has never had HPV before and just received the vaccine. . I wasn't sure if it would be a repeat pap or a colpo.

## 2023-10-04 ENCOUNTER — Encounter: Payer: Self-pay | Admitting: Obstetrics

## 2023-10-04 NOTE — Telephone Encounter (Signed)
 See result note attached to the pap result. Closing.

## 2023-12-17 ENCOUNTER — Ambulatory Visit (INDEPENDENT_AMBULATORY_CARE_PROVIDER_SITE_OTHER): Payer: BC Managed Care – PPO

## 2023-12-17 VITALS — BP 118/75 | HR 76 | Ht 65.0 in | Wt 153.2 lb

## 2023-12-17 DIAGNOSIS — Z23 Encounter for immunization: Secondary | ICD-10-CM

## 2023-12-17 NOTE — Progress Notes (Signed)
    NURSE VISIT NOTE  Subjective:    Patient ID: [name], female    DOB: [birth year], 23 y.o.   MRN: [id number]  HPI  Patient is a 23 y.o. G0P0000 female Single Caucasian female who presents for her second Gardasil injection. Order to administer given by Sofia Dunn, MD on 09/23/23.   Objective:    BP 118/75   Pulse 76   Ht 5\' 5"  (1.651 m)   Wt 153 lb 3.2 oz (69.5 kg)   BMI 25.49 kg/m    Contraception:  *Nexplanon  Given by: Lori [name], CMA Site:  left deltoid  Lab Review  No results found for any visits on 12/17/23.    Assessment:   1. Need for HPV vaccine      Plan:   Patient will return in 4 months for third injection.    Lori [name], CMA

## 2023-12-17 NOTE — Patient Instructions (Signed)
Human Papillomavirus (HPV) Vaccine Injection What is this medication? HUMAN PAPILLOMAVIRUS VACCINE (HYOO muhn pap uh LOH muh vahy ruhs vak SEEN) reduces the risk of human papillomavirus (HPV). It does not treat HPV. It is still possible to get HPV after receiving this vaccine, but the symptoms may be less severe or not last as long. It works by helping your immune system learn how to fight off a future infection. This medicine may be used for other purposes; ask your health care provider or pharmacist if you have questions. COMMON BRAND NAME(S): Gardasil 9 What should I tell my care team before I take this medication? They need to know if you have any of these conditions: Fever Hemophilia HIV or AIDS Immune system problems Infection Low platelets An unusual reaction to human papillomavirus vaccine, yeast, other vaccines, other medications, foods, dyes, or preservatives Pregnant or trying to get pregnant Breastfeeding How should I use this medication? This vaccine is injected into a muscle. It is given by your care team. This vaccine requires 2 or 3 doses to get the full benefit. Set a reminder for when your next dose is due. A copy of the Vaccine Information Statement will be given before each vaccination. Be sure to read this information carefully each time. This sheet may change often. Talk to your care team about the use of this medication in children. While it may be prescribed for children as young as 9 years for selected conditions, precautions do apply. Overdosage: If you think you have taken too much of this medicine contact a poison control center or emergency room at once. NOTE: This medicine is only for you. Do not share this medicine with others. What if I miss a dose? Keep appointments for follow-up doses as directed. It is important not to miss your dose. Call your care team if you are unable to keep an appointment. What may interact with this medication? Certain medications  for arthritis Medications for organ transplant Medications to treat cancer Steroid medications, such as prednisone or cortisone This list may not describe all possible interactions. Give your health care provider a list of all the medicines, herbs, non-prescription drugs, or dietary supplements you use. Also tell them if you smoke, drink alcohol, or use illegal drugs. Some items may interact with your medicine. What should I watch for while using this medication? Visit your care team regularly. Report any side effects to your care team right away. This vaccine, like all vaccines, may not fully protect everyone. What side effects may I notice from receiving this medication? Side effects that you should report to your care team as soon as possible: Allergic reactions--skin rash, itching, hives, swelling of the face, lips, tongue, or throat Feeling faint or lightheaded Side effects that usually do not require medical attention (report these to your care team if they continue or are bothersome): Diarrhea Dizziness Fatigue Fever Headache Nausea Pain, redness, irritation, or bruising at the injection site This list may not describe all possible side effects. Call your doctor for medical advice about side effects. You may report side effects to FDA at 1-800-FDA-1088. Where should I keep my medication? This vaccine is only given by your care team. It will not be stored at home. NOTE: This sheet is a summary. It may not cover all possible information. If you have questions about this medicine, talk to your doctor, pharmacist, or health care provider.  2024 Elsevier/Gold Standard (2021-12-31 00:00:00)

## 2024-04-26 ENCOUNTER — Ambulatory Visit: Payer: Self-pay

## 2024-05-26 ENCOUNTER — Ambulatory Visit (INDEPENDENT_AMBULATORY_CARE_PROVIDER_SITE_OTHER): Payer: Self-pay

## 2024-05-26 VITALS — BP 115/79 | HR 92 | Ht 65.0 in | Wt 166.0 lb

## 2024-05-26 DIAGNOSIS — Z23 Encounter for immunization: Secondary | ICD-10-CM | POA: Diagnosis not present

## 2024-05-26 NOTE — Patient Instructions (Signed)
 HPV (Human Papillomavirus) Vaccine: What You Need to Know Many vaccine information statements are available in Spanish and other languages. See PromoAge.com.br. 1. Why get vaccinated? HPV (human papillomavirus) vaccine can prevent infection with some types of human papillomavirus. HPV infections can cause certain types of cancers, including: cervical, vaginal, and vulvar cancers in women penile cancer in men anal cancers in both men and women cancers of tonsils, base of tongue, and back of throat (oropharyngeal cancer) in both men and women HPV infections can also cause anogenital warts. HPV vaccine can prevent over 90% of cancers caused by HPV. HPV is spread through intimate skin-to-skin or sexual contact. HPV infections are so common that nearly all people will get at least one type of HPV at some time in their lives. Most HPV infections go away on their own within 2 years. But sometimes HPV infections will last longer and can cause cancers later in life. 2. HPV vaccine HPV vaccine is routinely recommended for adolescents at 79 or 25 years of age to ensure they are protected before they are exposed to the virus. HPV vaccine may be given beginning at age 25 years and vaccination is recommended for everyone through 25 years of age. HPV vaccine may be given to adults 27 through 25 years of age, based on discussions between the patient and health care provider. Most children who get the first dose before 66 years of age need 2 doses of HPV vaccine. People who get the first dose at or after 62 years of age and younger people with certain immunocompromising conditions need 3 doses. Your health care provider can give you more information. HPV vaccine may be given at the same time as other vaccines. 3. Talk with your health care provider Tell your vaccination provider if the person getting the vaccine: Has had an allergic reaction after a previous dose of HPV vaccine, or has any severe,  life-threatening allergies Is pregnant--HPV vaccine is not recommended until after pregnancy In some cases, your health care provider may decide to postpone HPV vaccination until a future visit. People with minor illnesses, such as a cold, may be vaccinated. People who are moderately or severely ill should usually wait until they recover before getting HPV vaccine. Your health care provider can give you more information. 4. Risks of a vaccine reaction Soreness, redness, or swelling where the shot is given can happen after HPV vaccination. Fever or headache can happen after HPV vaccination. People sometimes faint after medical procedures, including vaccination. Tell your provider if you feel dizzy or have vision changes or ringing in the ears. As with any medicine, there is a very remote chance of a vaccine causing a severe allergic reaction, other serious injury, or death. 5. What if there is a serious problem? An allergic reaction could occur after the vaccinated person leaves the clinic. If you see signs of a severe allergic reaction (hives, swelling of the face and throat, difficulty breathing, a fast heartbeat, dizziness, or weakness), call 9-1-1 and get the person to the nearest hospital. For other signs that concern you, call your health care provider. Adverse reactions should be reported to the Vaccine Adverse Event Reporting System (VAERS). Your health care provider will usually file this report, or you can do it yourself. Visit the VAERS website at www.vaers.LAgents.no or call 256-510-8455. VAERS is only for reporting reactions, and VAERS staff members do not give medical advice. 6. The National Vaccine Injury Compensation Program The [employer] Vaccine Injury Compensation Program (VICP) is a  federal program that was created to compensate people who may have been injured by certain vaccines. Claims regarding alleged injury or death due to vaccination have a time limit for filing, which may be as  short as two years. Visit the VICP website at SpiritualWord.at or call 931-622-2373 to learn about the program and about filing a claim. 7. How can I learn more? Ask your health care provider. Call your local or state health department. Visit the website of the Food and Drug Administration (FDA) for vaccine package inserts and additional information at FinderList.no. Contact the Centers for Disease Control and Prevention (CDC): Call 737-738-9397 (1-800-CDC-INFO) or Visit CDC's website at PicCapture.uy. Source: CDC Vaccine Information Statement HPV Vaccine (03/08/2020) This same material is available at FootballExhibition.com.br for no charge. This information is not intended to replace advice given to you by your health care provider. Make sure you discuss any questions you have with your health care provider. Document Revised: 11/04/2022 Document Reviewed: 08/10/2022 Elsevier Patient Education  2024 [employer].

## 2024-05-26 NOTE — Progress Notes (Signed)
    NURSE VISIT NOTE  Subjective:    Patient ID: Lori [name], female    DOB: Dec 25, 2000, 23 y.o.   MRN: [id number]  HPI  Patient is a 23 y.o. G0P0000 female Single Caucasian female who presents for her third Gardasil injection. Order to administer given by Estil Mangle, MD on 09/23/2023.   Objective:    BP 115/79   Pulse 92   Ht 5' 5 (1.651 m)   Wt 166 lb (75.3 kg)   BMI 27.62 kg/m   23 y.o. LMP:  N/A   Contraception:  *Nexplanon  Given by: Rollo Maxin, CMA Site:  right deltoid  Lab Review  No results found for any visits on 05/26/24. Immunization History  Administered Date(s) Administered   HPV 9-valent 09/23/2023, 12/17/2023, 05/26/2024      Assessment:   1. Need for HPV vaccine      Plan:   Patient will return in prn. Gardasil series complete.    Rollo JINNY Maxin, CMA

## 2024-08-22 NOTE — Progress Notes (Unsigned)
" ° ° ° °  GYNECOLOGY OFFICE PROCEDURE NOTE  Crystol YASLENE LINDAMOOD is a 24 y.o. G0P0000 here for Nexplanon  {Insertion or Removal:24885}.    Last pap smear: Result Date Procedure Results Follow-ups  09/23/2023 Cytology - PAP Neisseria Gonorrhea: Negative Chlamydia: Negative Adequacy: Satisfactory for evaluation; transformation zone component PRESENT. Diagnosis: - Low grade squamous intraepithelial lesion (LSIL) (A) Comment: There are a few cells suggestive of a higher grade lesion. Clinical Comment: correlation is recommended. Comment: Normal Reference Ranger Chlamydia - Negative Comment: Normal Reference Range Neisseria Gonorrhea - Negative   09/23/2020 Cytology - PAP Neisseria Gonorrhea: Negative Chlamydia: Negative Trichomonas: Negative Adequacy: Satisfactory for evaluation; transformation zone component PRESENT. Diagnosis: - Negative for intraepithelial lesion or malignancy (NILM) Comment: Normal Reference Ranger Chlamydia - Negative Comment: Normal Reference Range Neisseria Gonorrhea - Negative Comment: Normal Reference Range Trichomonas - Negative      Nexplanon  {Insertion or Removal:24885} Procedure {Nexplanon  Insertion/Removal:24886}   Camelia Bars, LPN   98/79/73  "

## 2024-08-25 ENCOUNTER — Encounter: Payer: Self-pay | Admitting: Certified Nurse Midwife

## 2024-08-25 ENCOUNTER — Ambulatory Visit: Admitting: Certified Nurse Midwife

## 2024-08-25 VITALS — BP 102/69 | HR 94 | Ht 65.0 in | Wt 154.7 lb

## 2024-08-25 DIAGNOSIS — Z3046 Encounter for surveillance of implantable subdermal contraceptive: Secondary | ICD-10-CM | POA: Diagnosis not present

## 2024-09-26 ENCOUNTER — Ambulatory Visit: Admitting: Obstetrics

## 2024-10-13 ENCOUNTER — Ambulatory Visit: Admitting: Certified Nurse Midwife
